# Patient Record
Sex: Female | Born: 1995 | Hispanic: Yes | Marital: Married | Smoking: Light tobacco smoker
Health system: Southern US, Community
[De-identification: ages and names within clinical notes are randomized; demographics above are authoritative.]

## PROBLEM LIST (undated history)

## (undated) DIAGNOSIS — Z789 Other specified health status: Secondary | ICD-10-CM

## (undated) HISTORY — PX: WISDOM TOOTH EXTRACTION: SHX21

---

## 2019-09-12 DIAGNOSIS — J069 Acute upper respiratory infection, unspecified: Secondary | ICD-10-CM | POA: Diagnosis not present

## 2019-09-12 DIAGNOSIS — Z6827 Body mass index (BMI) 27.0-27.9, adult: Secondary | ICD-10-CM | POA: Diagnosis not present

## 2019-10-08 ENCOUNTER — Other Ambulatory Visit: Payer: Self-pay

## 2019-10-08 ENCOUNTER — Ambulatory Visit: Payer: HRSA Program | Attending: Internal Medicine

## 2019-10-08 DIAGNOSIS — Z20822 Contact with and (suspected) exposure to covid-19: Secondary | ICD-10-CM

## 2019-10-08 DIAGNOSIS — U071 COVID-19: Secondary | ICD-10-CM | POA: Insufficient documentation

## 2019-10-08 DIAGNOSIS — Z20828 Contact with and (suspected) exposure to other viral communicable diseases: Secondary | ICD-10-CM | POA: Diagnosis not present

## 2019-10-09 ENCOUNTER — Telehealth: Payer: Self-pay

## 2019-10-09 LAB — NOVEL CORONAVIRUS, NAA: SARS-CoV-2, NAA: DETECTED — AB

## 2019-10-09 NOTE — Progress Notes (Signed)
Moving orders to this encounter.  

## 2019-10-09 NOTE — Progress Notes (Signed)
Order(s) created erroneously. Erroneous order ID: [id number]  Order moved by: Brigitte Pulse  Order move date/time: 10/09/2019 12:34 PM  Source Patient: [id number]  Source Contact: 10/08/2019  Destination Patient: [id number]  Destination Contact: 10/08/2019

## 2019-10-09 NOTE — Progress Notes (Signed)
Order(s) created erroneously. Erroneous order ID: [id number]  Order moved by: Addalie Calles M  Order move date/time: 10/09/2019 12:34 PM  Source Patient: [id number]  Source Contact: 10/08/2019  Destination Patient: [id number]  Destination Contact: 10/08/2019 

## 2019-10-09 NOTE — Telephone Encounter (Signed)
Caller advise that result not back yet  

## 2019-10-09 NOTE — Telephone Encounter (Signed)
Patient called in to confirmed her Central lab results - DOB/Address verified - confirmed Positive results. Reviewed Patient Positive Test Results with patient.  Patient reports the following symptoms: some fatigue, scratchy throat, head ache, nasal congestion, cough, sneezing, runny nose.  Patient denies: shortness of breath, fever, diarrhea, vomiting, nausea, loss of smell/taste.  Patient advised she is new to the area and does not have a PCP yet. Suggested the following PCP offices for her to research to see if they are accepting new patients:  * [facility] [place] [facility] [place]  Also, reviewed how to access e-visit on MyChart and contacting [facility] for more PCP options.   Patient verbalized understanding, no further questions.

## 2019-12-03 ENCOUNTER — Ambulatory Visit (INDEPENDENT_AMBULATORY_CARE_PROVIDER_SITE_OTHER): Payer: BC Managed Care – PPO | Admitting: Family Medicine

## 2019-12-03 ENCOUNTER — Other Ambulatory Visit: Payer: Self-pay

## 2019-12-03 ENCOUNTER — Encounter: Payer: Self-pay | Admitting: Family Medicine

## 2019-12-03 VITALS — BP 114/77 | HR 65 | Ht 60.0 in | Wt 137.0 lb

## 2019-12-03 DIAGNOSIS — Z113 Encounter for screening for infections with a predominantly sexual mode of transmission: Secondary | ICD-10-CM

## 2019-12-03 DIAGNOSIS — Z01419 Encounter for gynecological examination (general) (routine) without abnormal findings: Secondary | ICD-10-CM | POA: Diagnosis not present

## 2019-12-03 DIAGNOSIS — Z8349 Family history of other endocrine, nutritional and metabolic diseases: Secondary | ICD-10-CM | POA: Diagnosis not present

## 2019-12-03 DIAGNOSIS — Z124 Encounter for screening for malignant neoplasm of cervix: Secondary | ICD-10-CM | POA: Diagnosis not present

## 2019-12-03 DIAGNOSIS — Z8616 Personal history of COVID-19: Secondary | ICD-10-CM | POA: Insufficient documentation

## 2019-12-03 DIAGNOSIS — R7611 Nonspecific reaction to tuberculin skin test without active tuberculosis: Secondary | ICD-10-CM | POA: Diagnosis not present

## 2019-12-03 DIAGNOSIS — Z833 Family history of diabetes mellitus: Secondary | ICD-10-CM

## 2019-12-03 NOTE — Progress Notes (Signed)
GYNECOLOGY ANNUAL PREVENTATIVE CARE ENCOUNTER NOTE  Subjective:   Denise Wu is a 24 y.o. G0P0000 female here for a routine annual gynecologic exam.  Current complaints: Here to establish care.     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 Born in [place]- lived there for 9 years. Mother was treated for latent TB during her pregnancies when here in [place].   Denies abnormal vaginal bleeding, discharge, pelvic pain, problems with intercourse or other gynecologic concerns.    Gynecologic History Patient's last menstrual period was 11/21/2019 (exact date). Contraception: rhythm method Last Pap: Never had Last mammogram: NA  The following portions of the patient's history were reviewed and updated as appropriate: allergies, current medications, past family history, past medical history, past social history, past surgical history and problem list.  Review of Systems Pertinent items are noted in HPI.   Objective:  BP 114/77   Pulse 65   Ht 5' (1.524 m)   Wt 137 lb (62.1 kg)   LMP 11/21/2019 (Exact Date)   BMI 26.76 kg/m  CONSTITUTIONAL: Well-developed, well-nourished female in no acute distress.  HENT:  Normocephalic, atraumatic, External right and left ear normal. Oropharynx is clear and moist EYES:  No scleral icterus.  NECK: Normal range of motion, supple, no masses.  Normal thyroid.  SKIN: Skin is warm and dry. No rash noted. Not diaphoretic. No erythema. No pallor. NEUROLOGIC: Alert and oriented to person, place, and time. Normal reflexes, muscle tone coordination. No cranial nerve deficit noted. PSYCHIATRIC: Normal mood and affect. Normal behavior. Normal judgment and thought content. CARDIOVASCULAR: Normal heart rate noted, regular rhythm. 2+ distal pulses. RESPIRATORY: Effort and breath sounds normal, no problems with respiration noted. BREASTS: Symmetric in size. No masses, skin changes, nipple drainage, or lymphadenopathy. ABDOMEN: Soft,  no distention noted.  No  tenderness, rebound or guarding.  PELVIC: Normal appearing external genitalia; normal appearing vaginal mucosa and cervix.  No abnormal discharge noted.  Pap smear obtained.  Normal uterine size, no other palpable masses, no uterine or adnexal tenderness. MUSCULOSKELETAL: Normal range of motion.    Assessment and Plan:    1. PPD positive Discussed latent TB. Will check labs to confirm whether positive PPD was related to exposure vs BCG.  I recommend latent TB treatment prior to pregnancy.  - QuantiFERON-TB Gold Plus  2. Well woman exam with routine gynecological exam  Annual gynecologic examination with pap smear:  Will follow up results of pap smear and manage accordingly. STI screen also ordered today.  Routine preventative health maintenance measures emphasized.  Contraception counseling: Reviewed all forms of birth control options available including abstinence; over the counter/barrier methods; hormonal contraceptive medication including pill, patch, ring, injection,contraceptive implant; hormonal and nonhormonal IUDs; permanent sterilization options including vasectomy and the various tubal sterilization modalities. Risks and benefits reviewed.  Questions were answered.  Written information was also given to the patient to review.  Patient desires natural family planning, this was prescribed for patient. She will follow up in  54yr for surveillance.  She was told to call with any further questions, or with any concerns about this method of contraception.  Emphasized use of condoms 100% of the time for STI prevention.  - Cytology - PAP - QuantiFERON-TB Gold Plus - Hemoglobin A1c - TSH - Recommended starting PNV - Reviewed fertility tracking - Discussed healthy diet changes and importance of exercise   3. History of COVID-19 Sx resolved.  4. Family history of hypothyroidism - TSH  5. Family history of diabetes mellitus -  Hemoglobin A1c   Please refer to After Visit Summary for  other counseling recommendations.   Return in about 1 year (around 12/02/2020) for Yearly wellness exam.  Caren Macadam, MD, MPH, ABFM Attending Physician Center for [facility]

## 2019-12-04 LAB — CYTOLOGY - PAP
Chlamydia: NEGATIVE
Comment: NEGATIVE
Comment: NORMAL
Diagnosis: NEGATIVE
Neisseria Gonorrhea: NEGATIVE

## 2019-12-04 LAB — HEMOGLOBIN A1C
Est. average glucose Bld gHb Est-mCnc: 111 mg/dL
Hgb A1c MFr Bld: 5.5 % (ref 4.8–5.6)

## 2019-12-04 LAB — TSH: TSH: 1.25 u[IU]/mL (ref 0.450–4.500)

## 2019-12-06 LAB — QUANTIFERON-TB GOLD PLUS
QuantiFERON Mitogen Value: >10 IU/mL
QuantiFERON Nil Value: 0.08 IU/mL
QuantiFERON TB1 Ag Value: 0.08 IU/mL
QuantiFERON TB2 Ag Value: 0.07 IU/mL
QuantiFERON-TB Gold Plus: NEGATIVE

## 2020-01-24 ENCOUNTER — Ambulatory Visit: Payer: BC Managed Care – PPO | Attending: Internal Medicine

## 2020-01-24 DIAGNOSIS — Z23 Encounter for immunization: Secondary | ICD-10-CM

## 2020-01-24 NOTE — Progress Notes (Signed)
   Covid-19 Vaccination Clinic  Name:  Denise Wu    MRN: [id number] DOB: Oct 27, 1995  01/24/2020  [name] was observed post Covid-19 immunization for 15 minutes without incident. She was provided with Vaccine Information Sheet and instruction to access the V-Safe system.   [name] was instructed to call 911 with any severe reactions post vaccine: Marland Kitchen Difficulty breathing  . Swelling of face and throat  . A fast heartbeat  . A bad rash all over body  . Dizziness and weakness   Immunizations Administered    Name Date Dose VIS Date Route   Pfizer COVID-19 Vaccine 01/24/2020  3:51 PM 0.3 mL 10/03/2019 Intramuscular   Manufacturer: [employer], [employer]   Lot: [id number]   NDC: [address]-4

## 2020-02-14 ENCOUNTER — Ambulatory Visit: Payer: BC Managed Care – PPO | Attending: Internal Medicine

## 2020-02-14 DIAGNOSIS — Z23 Encounter for immunization: Secondary | ICD-10-CM

## 2020-02-14 NOTE — Progress Notes (Signed)
   Covid-19 Vaccination Clinic  Name:  Denise Wu    MRN: [id number] DOB: [birth year]  02/14/2020  [name] was observed post Covid-19 immunization for 15 minutes without incident. She was provided with Vaccine Information Sheet and instruction to access the V-Safe system.   [name] was instructed to call 911 with any severe reactions post vaccine: Marland Kitchen Difficulty breathing  . Swelling of face and throat  . A fast heartbeat  . A bad rash all over body  . Dizziness and weakness   Immunizations Administered    Name Date Dose VIS Date Route   Pfizer COVID-19 Vaccine 02/14/2020 12:23 PM 0.3 mL 12/17/2018 Intramuscular   Manufacturer: [employer], [employer]   Lot: [id number]   NDC: [address]-7

## 2020-06-22 ENCOUNTER — Encounter: Payer: Self-pay | Admitting: Radiology

## 2020-06-22 ENCOUNTER — Ambulatory Visit: Payer: BC Managed Care – PPO

## 2020-06-22 ENCOUNTER — Other Ambulatory Visit: Payer: Self-pay

## 2020-06-22 ENCOUNTER — Ambulatory Visit (INDEPENDENT_AMBULATORY_CARE_PROVIDER_SITE_OTHER): Payer: BC Managed Care – PPO

## 2020-06-22 VITALS — BP 115/75 | HR 72

## 2020-06-22 DIAGNOSIS — Z3201 Encounter for pregnancy test, result positive: Secondary | ICD-10-CM | POA: Diagnosis not present

## 2020-06-22 LAB — POCT URINE PREGNANCY: Preg Test, Ur: POSITIVE — AB

## 2020-06-22 NOTE — Progress Notes (Signed)
Denise Wu presents today for UPT. She does not have any  complaint at this time. LMP: 05/14/2020    OBJECTIVE: Appears well, in no apparent distress.  OB History    Gravida  0   Para  0   Term  0   Preterm  0   AB  0   Living  0     SAB  0   TAB  0   Ectopic  0   Multiple  0   Live Births  0          Home UPT Result: positive In-Office UPT result: positive I have reviewed the patient's medical, obstetrical, social, and family histories, and medications.   ASSESSMENT: Positive pregnancy test  PLAN Prenatal care to be completed at: Patient will start in our office around October. Patient has been advised to start taking prenatal vitamins.

## 2020-07-27 ENCOUNTER — Encounter: Payer: Self-pay | Admitting: Obstetrics & Gynecology

## 2020-07-27 ENCOUNTER — Other Ambulatory Visit (HOSPITAL_COMMUNITY)
Admission: RE | Admit: 2020-07-27 | Discharge: 2020-07-27 | Disposition: A | Discharge status: Home / Self Care | Payer: BC Managed Care – PPO | Source: Ambulatory Visit | Attending: Obstetrics & Gynecology | Admitting: Obstetrics & Gynecology

## 2020-07-27 ENCOUNTER — Other Ambulatory Visit: Payer: Self-pay

## 2020-07-27 ENCOUNTER — Ambulatory Visit (INDEPENDENT_AMBULATORY_CARE_PROVIDER_SITE_OTHER): Payer: BC Managed Care – PPO | Admitting: Obstetrics & Gynecology

## 2020-07-27 VITALS — BP 116/75 | HR 66 | Wt 135.0 lb

## 2020-07-27 DIAGNOSIS — Z3A10 10 weeks gestation of pregnancy: Secondary | ICD-10-CM | POA: Diagnosis not present

## 2020-07-27 DIAGNOSIS — Z3401 Encounter for supervision of normal first pregnancy, first trimester: Secondary | ICD-10-CM | POA: Diagnosis not present

## 2020-07-27 DIAGNOSIS — Z3403 Encounter for supervision of normal first pregnancy, third trimester: Secondary | ICD-10-CM | POA: Insufficient documentation

## 2020-07-27 NOTE — Progress Notes (Signed)
DATING AND VIABILITY SONOGRAM   Denise Wu is a 24 y.o. year old G1P0000 with LMP Patient's last menstrual period was 05/14/2020. which would correlate to  [redacted]w[redacted]d weeks gestation.  She has regular menstrual cycles.   She is here today for a confirmatory initial sonogram.    GESTATION: SINGLETON yes     FETAL ACTIVITY:          Heart rate    160          The fetus is active.    GESTATIONAL AGE AND  BIOMETRICS:  Gestational criteria: Estimated Date of Delivery: 02/18/21 by LMP now at [redacted]w[redacted]d  Previous Scans:0  GESTATIONAL SAC            mm         weeks  CROWN RUMP LENGTH           3.79 mm         10.4 weeks                                                   AVERAGE EGA(BY THIS SCAN):  10.4 weeks  WORKING EDD( LMP ):  02/18/2021     TECHNICIAN COMMENTS:  Patient informed that the ultrasound is considered a limited obstetric ultrasound and is not intended to be a complete ultrasound exam. Patient also informed that the ultrasound is not being completed with the intent of assessing for fetal or placental anomalies or any pelvic abnormalities. Explained that the purpose of today's ultrasound is to assess for fetal heart rate. Patient acknowledges the purpose of the exam and the limitations of the study.        A copy of this report including all images has been saved and backed up to a second source for retrieval if needed. All measures and details of the anatomical scan, placentation, fluid volume and pelvic anatomy are contained in that report.  Denise Wu 07/27/2020 8:49 AM

## 2020-07-27 NOTE — Patient Instructions (Signed)
First Trimester of Pregnancy The first trimester of pregnancy is from week 1 until the end of week 13 (months 1 through 3). A week after a sperm fertilizes an egg, the egg will implant on the wall of the uterus. This embryo will begin to develop into a baby. Genes from you and your partner will form the baby. The female genes will determine whether the baby will be a boy or a girl. At 6-8 weeks, the eyes and face will be formed, and the heartbeat can be seen on ultrasound. At the end of 12 weeks, all the baby's organs will be formed. Now that you are pregnant, you will want to do everything you can to have a healthy baby. Two of the most important things are to get good prenatal care and to follow your health care provider's instructions. Prenatal care is all the medical care you receive before the baby's birth. This care will help prevent, find, and treat any problems during the pregnancy and childbirth. Body changes during your first trimester Your body goes through many changes during pregnancy. The changes vary from woman to woman.  You may gain or lose a couple of pounds at first.  You may feel sick to your stomach (nauseous) and you may throw up (vomit). If the vomiting is uncontrollable, call your health care provider.  You may tire easily.  You may develop headaches that can be relieved by medicines. All medicines should be approved by your health care provider.  You may urinate more often. Painful urination may mean you have a bladder infection.  You may develop heartburn as a result of your pregnancy.  You may develop constipation because certain hormones are causing the muscles that push stool through your intestines to slow down.  You may develop hemorrhoids or swollen veins (varicose veins).  Your breasts may begin to grow larger and become tender. Your nipples may stick out more, and the tissue that surrounds them (areola) may become darker.  Your gums may bleed and may be  sensitive to brushing and flossing.  Dark spots or blotches (chloasma, mask of pregnancy) may develop on your face. This will likely fade after the baby is born.  Your menstrual periods will stop.  You may have a loss of appetite.  You may develop cravings for certain kinds of food.  You may have changes in your emotions from day to day, such as being excited to be pregnant or being concerned that something may go wrong with the pregnancy and baby.  You may have more vivid and strange dreams.  You may have changes in your hair. These can include thickening of your hair, rapid growth, and changes in texture. Some women also have hair loss during or after pregnancy, or hair that feels dry or thin. Your hair will most likely return to normal after your baby is born. What to expect at prenatal visits During a routine prenatal visit:  You will be weighed to make sure you and the baby are growing normally.  Your blood pressure will be taken.  Your abdomen will be measured to track your baby's growth.  The fetal heartbeat will be listened to between weeks 10 and 14 of your pregnancy.  Test results from any previous visits will be discussed. Your health care provider may ask you:  How you are feeling.  If you are feeling the baby move.  If you have had any abnormal symptoms, such as leaking fluid, bleeding, severe headaches, or abdominal   cramping.  If you are using any tobacco products, including cigarettes, chewing tobacco, and electronic cigarettes.  If you have any questions. Other tests that may be performed during your first trimester include:  Blood tests to find your blood type and to check for the presence of any previous infections. The tests will also be used to check for low iron levels (anemia) and protein on red blood cells (Rh antibodies). Depending on your risk factors, or if you previously had diabetes during pregnancy, you may have tests to check for high blood sugar  that affects pregnant women (gestational diabetes).  Urine tests to check for infections, diabetes, or protein in the urine.  An ultrasound to confirm the proper growth and development of the baby.  Fetal screens for spinal cord problems (spina bifida) and Down syndrome.  HIV (human immunodeficiency virus) testing. Routine prenatal testing includes screening for HIV, unless you choose not to have this test.  You may need other tests to make sure you and the baby are doing well. Follow these instructions at home: Medicines  Follow your health care provider's instructions regarding medicine use. Specific medicines may be either safe or unsafe to take during pregnancy.  Take a prenatal vitamin that contains at least 600 micrograms (mcg) of folic acid.  If you develop constipation, try taking a stool softener if your health care provider approves. Eating and drinking   Eat a balanced diet that includes fresh fruits and vegetables, whole grains, good sources of protein such as meat, eggs, or tofu, and low-fat dairy. Your health care provider will help you determine the amount of weight gain that is right for you.  Avoid raw meat and uncooked cheese. These carry germs that can cause birth defects in the baby.  Eating four or five small meals rather than three large meals a day may help relieve nausea and vomiting. If you start to feel nauseous, eating a few soda crackers can be helpful. Drinking liquids between meals, instead of during meals, also seems to help ease nausea and vomiting.  Limit foods that are high in fat and processed sugars, such as fried and sweet foods.  To prevent constipation: ? Eat foods that are high in fiber, such as fresh fruits and vegetables, whole grains, and beans. ? Drink enough fluid to keep your urine clear or pale yellow. Activity  Exercise only as directed by your health care provider. Most women can continue their usual exercise routine during  pregnancy. Try to exercise for 30 minutes at least 5 days a week. Exercising will help you: ? Control your weight. ? Stay in shape. ? Be prepared for labor and delivery.  Experiencing pain or cramping in the lower abdomen or lower back is a good sign that you should stop exercising. Check with your health care provider before continuing with normal exercises.  Try to avoid standing for long periods of time. Move your legs often if you must stand in one place for a long time.  Avoid heavy lifting.  Wear low-heeled shoes and practice good posture.  You may continue to have sex unless your health care provider tells you not to. Relieving pain and discomfort  Wear a good support bra to relieve breast tenderness.  Take warm sitz baths to soothe any pain or discomfort caused by hemorrhoids. Use hemorrhoid cream if your health care provider approves.  Rest with your legs elevated if you have leg cramps or low back pain.  If you develop varicose veins in   your legs, wear support hose. Elevate your feet for 15 minutes, 3-4 times a day. Limit salt in your diet. Prenatal care  Schedule your prenatal visits by the twelfth week of pregnancy. They are usually scheduled monthly at first, then more often in the last 2 months before delivery.  Write down your questions. Take them to your prenatal visits.  Keep all your prenatal visits as told by your health care provider. This is important. Safety  Wear your seat belt at all times when driving.  Make a list of emergency phone numbers, including numbers for family, friends, the hospital, and police and fire departments. General instructions  Ask your health care provider for a referral to a local prenatal education class. Begin classes no later than the beginning of month 6 of your pregnancy.  Ask for help if you have counseling or nutritional needs during pregnancy. Your health care provider can offer advice or refer you to specialists for help  with various needs.  Do not use hot tubs, steam rooms, or saunas.  Do not douche or use tampons or scented sanitary pads.  Do not cross your legs for long periods of time.  Avoid cat litter boxes and soil used by cats. These carry germs that can cause birth defects in the baby and possibly loss of the fetus by miscarriage or stillbirth.  Avoid all smoking, herbs, alcohol, and medicines not prescribed by your health care provider. Chemicals in these products affect the formation and growth of the baby.  Do not use any products that contain nicotine or tobacco, such as cigarettes and e-cigarettes. If you need help quitting, ask your health care provider. You may receive counseling support and other resources to help you quit.  Schedule a dentist appointment. At home, brush your teeth with a soft toothbrush and be gentle when you floss. Contact a health care provider if:  You have dizziness.  You have mild pelvic cramps, pelvic pressure, or nagging pain in the abdominal area.  You have persistent nausea, vomiting, or diarrhea.  You have a bad smelling vaginal discharge.  You have pain when you urinate.  You notice increased swelling in your face, hands, legs, or ankles.  You are exposed to fifth disease or chickenpox.  You are exposed to German measles (rubella) and have never had it. Get help right away if:  You have a fever.  You are leaking fluid from your vagina.  You have spotting or bleeding from your vagina.  You have severe abdominal cramping or pain.  You have rapid weight gain or loss.  You vomit blood or material that looks like coffee grounds.  You develop a severe headache.  You have shortness of breath.  You have any kind of trauma, such as from a fall or a car accident. Summary  The first trimester of pregnancy is from week 1 until the end of week 13 (months 1 through 3).  Your body goes through many changes during pregnancy. The changes vary from  woman to woman.  You will have routine prenatal visits. During those visits, your health care provider will examine you, discuss any test results you may have, and talk with you about how you are feeling. This information is not intended to replace advice given to you by your health care provider. Make sure you discuss any questions you have with your health care provider. Document Revised: 09/21/2017 Document Reviewed: 09/20/2016 Elsevier Patient Education  2020 Elsevier Inc.  

## 2020-07-27 NOTE — Progress Notes (Signed)
History:   Denise Wu is a 24 y.o. G1P0000 at [redacted]w[redacted]d by LMP and office scan today being seen today for her first obstetrical visit.  Patient does intend to breast feed. Pregnancy history fully reviewed.  Patient reports no complaints.  Resolved nausea from earlier this pregnancy.      HISTORY: OB History  Gravida Para Term Preterm AB Living  1 0 0 0 0 0  SAB TAB Ectopic Multiple Live Births  0 0 0 0 0    # Outcome Date GA Lbr Len/2nd Weight Sex Delivery Anes PTL Lv  1 Current           Last pap smear was done 12/03/2019 and was normal  History reviewed. No pertinent past medical history. Past Surgical History:  Procedure Laterality Date  . WISDOM TOOTH EXTRACTION     Family History  Problem Relation Age of Onset  . Hypothyroidism Mother   . Diabetes Paternal Grandfather    Social History   Tobacco Use  . Smoking status: Light Tobacco Smoker  . Smokeless tobacco: Never Used  . Tobacco comment: socailly  Vaping Use  . Vaping Use: Never assessed  Substance Use Topics  . Alcohol use: Yes    Comment: social   . Drug use: Not Currently   No Known Allergies Current Outpatient Medications on File Prior to Visit  Medication Sig Dispense Refill  . Prenatal Vit-Fe Fumarate-FA (MULTIVITAMIN-PRENATAL) 27-0.8 MG TABS tablet Take 1 tablet by mouth daily at 12 noon.     No current facility-administered medications on file prior to visit.    Review of Systems Pertinent items noted in HPI and remainder of comprehensive ROS otherwise negative. Physical Exam:   Vitals:   07/27/20 0835  BP: 116/75  Pulse: 66  Weight: 135 lb (61.2 kg)   Fetal Heart Rate (bpm): 160 bpm on ultrasound      General: well-developed, well-nourished female in no acute distress  Breasts:  normal appearance, no masses or tenderness bilaterally  Skin: normal coloration and turgor, no rashes  Neurologic: oriented, normal, negative, normal mood  Extremities: normal strength, tone, and muscle mass,  ROM of all joints is normal  Mouth/Teeth mucous membranes moist, pharynx normal without lesions and dental hygiene good  Neck supple and no masses  Cardiovascular: regular rate and rhythm  Respiratory:  no respiratory distress, normal breath sounds  Abdomen: soft, non-tender; bowel sounds normal; no masses,  no organomegaly    Assessment:    Pregnancy: G1P0000 Patient Active Problem List   Diagnosis Date Noted  . Encounter for supervision of normal first pregnancy in first trimester 07/27/2020  . PPD positive 12/03/2019  . History of COVID-19 12/03/2019     Plan:    1. [redacted] weeks gestation of pregnancy 2. Encounter for supervision of normal first pregnancy in first trimester - CBC/D/Plt+RPR+Rh+ABO+Rub Ab... - Culture, OB Urine - GC/Chlamydia probe amp ([facility])not at [facility] [place] M[facility] OB COMP + 14 WK; Future - Enroll Patient in Babyscripts Initial labs drawn. Continue prenatal vitamins. Problem list reviewed and updated. Genetic Screening discussed: considering her options. Ultrasound discussed; fetal anatomic survey: ordered. Anticipatory guidance about prenatal visits given including labs, ultrasounds, and testing. Discussed usage of Babyscripts and virtual visits as additional source of managing and completing prenatal visits in midst of coronavirus and pandemic.   Encouraged to complete MyChart Registration for her ability to review results, send requests, and have questions addressed.  The nature of [facility]  Healthcare/Faculty Practice with multiple MDs and Advanced Practice Providers was explained to patient; also emphasized that residents, students are part of our team. Routine obstetric precautions reviewed. Encouraged to seek out care at office or emergency room [facility] preferred) for urgent and/or emergent concerns. Return in about 4 weeks (around 08/24/2020) for OFFICE OB Visit.     Jaynie Collins, MD, FACOG Obstetrician & Gynecologist,  [facility] for [employer], [facility]

## 2020-07-28 LAB — GC/CHLAMYDIA PROBE AMP (~~LOC~~) NOT AT ARMC
Chlamydia: NEGATIVE
Comment: NEGATIVE
Comment: NORMAL
Neisseria Gonorrhea: NEGATIVE

## 2020-07-28 LAB — CBC/D/PLT+RPR+RH+ABO+RUB AB...
Antibody Screen: NEGATIVE
Basophils Absolute: 0 10*3/uL (ref 0.0–0.2)
Basos: 0 %
EOS (ABSOLUTE): 0.1 10*3/uL (ref 0.0–0.4)
Eos: 1 %
HCV Ab: <0.1 s/co ratio (ref 0.0–0.9)
HIV Screen 4th Generation wRfx: NONREACTIVE
Hematocrit: 40.6 % (ref 34.0–46.6)
Hemoglobin: 13.6 g/dL (ref 11.1–15.9)
Hepatitis B Surface Ag: NEGATIVE
Immature Grans (Abs): 0 10*3/uL (ref 0.0–0.1)
Immature Granulocytes: 0 %
Lymphocytes Absolute: 1.4 10*3/uL (ref 0.7–3.1)
Lymphs: 21 %
MCH: 30.4 pg (ref 26.6–33.0)
MCHC: 33.5 g/dL (ref 31.5–35.7)
MCV: 91 fL (ref 79–97)
Monocytes Absolute: 0.3 10*3/uL (ref 0.1–0.9)
Monocytes: 5 %
Neutrophils Absolute: 4.6 10*3/uL (ref 1.4–7.0)
Neutrophils: 73 %
Platelets: 290 10*3/uL (ref 150–450)
RBC: 4.48 x10E6/uL (ref 3.77–5.28)
RDW: 12.9 % (ref 11.7–15.4)
RPR Ser Ql: NONREACTIVE
Rh Factor: POSITIVE
Rubella Antibodies, IGG: 6.55 index (ref >0.99)
WBC: 6.4 10*3/uL (ref 3.4–10.8)

## 2020-07-28 LAB — HCV INTERPRETATION

## 2020-07-29 LAB — URINE CULTURE, OB REFLEX: Organism ID, Bacteria: NO GROWTH

## 2020-07-29 LAB — CULTURE, OB URINE

## 2020-09-01 ENCOUNTER — Other Ambulatory Visit: Payer: Self-pay

## 2020-09-01 ENCOUNTER — Ambulatory Visit (INDEPENDENT_AMBULATORY_CARE_PROVIDER_SITE_OTHER): Payer: BC Managed Care – PPO | Admitting: Advanced Practice Midwife

## 2020-09-01 VITALS — BP 112/68 | HR 64 | Wt 133.2 lb

## 2020-09-01 DIAGNOSIS — Z3402 Encounter for supervision of normal first pregnancy, second trimester: Secondary | ICD-10-CM

## 2020-09-01 NOTE — Patient Instructions (Signed)
Second Trimester of Pregnancy  The second trimester is from week 14 through week 27 (month 4 through 6). This is often the time in pregnancy that you feel your best. Often times, morning sickness has lessened or quit. You may have more energy, and you may get hungry more often. Your unborn baby is growing rapidly. At the end of the sixth month, he or she is about 9 inches long and weighs about 1 pounds. You will likely feel the baby move between 18 and 20 weeks of pregnancy. Follow these instructions at home: Medicines  Take over-the-counter and prescription medicines only as told by your doctor. Some medicines are safe and some medicines are not safe during pregnancy.  Take a prenatal vitamin that contains at least 600 micrograms (mcg) of folic acid.  If you have trouble pooping (constipation), take medicine that will make your stool soft (stool softener) if your doctor approves. Eating and drinking   Eat regular, healthy meals.  Avoid raw meat and uncooked cheese.  If you get low calcium from the food you eat, talk to your doctor about taking a daily calcium supplement.  Avoid foods that are high in fat and sugars, such as fried and sweet foods.  If you feel sick to your stomach (nauseous) or throw up (vomit): ? Eat 4 or 5 small meals a day instead of 3 large meals. ? Try eating a few soda crackers. ? Drink liquids between meals instead of during meals.  To prevent constipation: ? Eat foods that are high in fiber, like fresh fruits and vegetables, whole grains, and beans. ? Drink enough fluids to keep your pee (urine) clear or pale yellow. Activity  Exercise only as told by your doctor. Stop exercising if you start to have cramps.  Do not exercise if it is too hot, too humid, or if you are in a place of great height (high altitude).  Avoid heavy lifting.  Wear low-heeled shoes. Sit and stand up straight.  You can continue to have sex unless your doctor tells you not  to. Relieving pain and discomfort  Wear a good support bra if your breasts are tender.  Take warm water baths (sitz baths) to soothe pain or discomfort caused by hemorrhoids. Use hemorrhoid cream if your doctor approves.  Rest with your legs raised if you have leg cramps or low back pain.  If you develop puffy, bulging veins (varicose veins) in your legs: ? Wear support hose or compression stockings as told by your doctor. ? Raise (elevate) your feet for 15 minutes, 3-4 times a day. ? Limit salt in your food. Prenatal care  Write down your questions. Take them to your prenatal visits.  Keep all your prenatal visits as told by your doctor. This is important. Safety  Wear your seat belt when driving.  Make a list of emergency phone numbers, including numbers for family, friends, the hospital, and police and fire departments. General instructions  Ask your doctor about the right foods to eat or for help finding a counselor, if you need these services.  Ask your doctor about local prenatal classes. Begin classes before month 6 of your pregnancy.  Do not use hot tubs, steam rooms, or saunas.  Do not douche or use tampons or scented sanitary pads.  Do not cross your legs for long periods of time.  Visit your dentist if you have not done so. Use a soft toothbrush to brush your teeth. Floss gently.  Avoid all smoking, herbs,   and alcohol. Avoid drugs that are not approved by your doctor.  Do not use any products that contain nicotine or tobacco, such as cigarettes and e-cigarettes. If you need help quitting, ask your doctor.  Avoid cat litter boxes and soil used by cats. These carry germs that can cause birth defects in the baby and can cause a loss of your baby (miscarriage) or stillbirth. Contact a doctor if:  You have mild cramps or pressure in your lower belly.  You have pain when you pee (urinate).  You have bad smelling fluid coming from your vagina.  You continue to  feel sick to your stomach (nauseous), throw up (vomit), or have watery poop (diarrhea).  You have a nagging pain in your belly area.  You feel dizzy. Get help right away if:  You have a fever.  You are leaking fluid from your vagina.  You have spotting or bleeding from your vagina.  You have severe belly cramping or pain.  You lose or gain weight rapidly.  You have trouble catching your breath and have chest pain.  You notice sudden or extreme puffiness (swelling) of your face, hands, ankles, feet, or legs.  You have not felt the baby move in over an hour.  You have severe headaches that do not go away when you take medicine.  You have trouble seeing. Summary  The second trimester is from week 14 through week 27 (months 4 through 6). This is often the time in pregnancy that you feel your best.  To take care of yourself and your unborn baby, you will need to eat healthy meals, take medicines only if your doctor tells you to do so, and do activities that are safe for you and your baby.  Call your doctor if you get sick or if you notice anything unusual about your pregnancy. Also, call your doctor if you need help with the right food to eat, or if you want to know what activities are safe for you. This information is not intended to replace advice given to you by your health care provider. Make sure you discuss any questions you have with your health care provider. Document Revised: 01/31/2019 Document Reviewed: 11/14/2016 Elsevier Patient Education  2020 [employer].  DOULA LIST   [facility] Doula  [place]  [phone]  [email or web address]  [email or web address]  [place] [employer] [phone]  [email or web address]   [employer]   Precious Danford Bad   [email or web address]   ??THE MOTHERLY DOULA?? Zola Button   [phone]   [email or web address]     [employer] Doula   Olive Bass  [phone]    [email or web address]   [employer] [facility]  Angie Rosier     [phone]     [email or web address]   Renato Gails: Doula & Photographer   Renato Gails [phone]       [email or web address]  [email or web address]   [employer] Doula Services  [place] [phone]   [email or web address]   [place], [place]  Lolita Rieger  [phone]  [email or web address]   [email or web address]   [facility] [facility]   Kizzie Furnish   [phone]  [email or web address]   [facility] Doula  Dina Rich  [phone] [email or web address]  Natural Baby Doulas  Cornelious Bryant         [facility]       Lora Reynolds     [phone] [email or web address]  [email or web address]  [facility]   [place] [phone] [email or web address]   [employer] [facility]  Camelia Eng     [phone]  [email or web address]  [facility]     [phone]  [email or web address]  Facebook and IG [email or web address] .  [phone] [email or web address] .[phone] [email or web address] .[email or web address]   [phone]  [phone] [email or web address]     [facility]  [phone] [email or web address]   IG [email or web address]    [phone]    [phone] [email or web address]   Lurline Hare "[place]" Free  [phone] [email or web address]    Mtende Roll  [phone] [email or web address]   Susie Williams   [email or web address]    [phone]    [phone] [email or web address]     Denita Lung  [phone] [email or web address]    Lenard Galloway  [phone] [email or web address]    Wrens Rhem    [phone]   [facility] on the Brain Red wing   [phone] [email or web address]  Doula Mama [phone] [email or web address] [email or web address]  Baby on the Brain Maryjean Ka  [phone] [email or web address]  [facility] [phone]  [email or web address]  [email or web address]-Jones  [phone] [email or web address] .[email or web address]   Allen Kell [phone] [email or web address] .Ardine Eng   [phone] [email or web address]   Gilda Crease  [phone] [email or web address]  [employer]  Jonelle Sports  [phone] [email or web address]   Jackelyn Poling [phone] [email or web address]  MOOR Divine Myeka Dunn  [email or web address]   Cristina Gong [phone] [email or web address]   Glory Buff [phone] [email or web address]   Whitney Muse [phone] [email or web address]

## 2020-09-01 NOTE — Progress Notes (Signed)
   PRENATAL VISIT NOTE  Subjective:  Denise Wu is a 24 y.o. G1P0000 at [redacted]w[redacted]d being seen today for ongoing prenatal care.  She is currently monitored for the following issues for this low-risk pregnancy and has PPD positive; History of COVID-19; and Encounter for supervision of normal first pregnancy in first trimester on their problem list.  Patient reports no complaints.  Contractions: Not present. Vag. Bleeding: None.   . Denies leaking of fluid.   The following portions of the patient's history were reviewed and updated as appropriate: allergies, current medications, past family history, past medical history, past social history, past surgical history and problem list. Problem list updated.  Objective:   Vitals:   09/01/20 1545  BP: 112/68  Pulse: 64  Weight: 133 lb 3.2 oz (60.4 kg)    Fetal Status: Fetal Heart Rate (bpm): 149         General:  Alert, oriented and cooperative. Patient is in no acute distress.  Skin: Skin is warm and dry. No rash noted.   Cardiovascular: Normal heart rate noted  Respiratory: Normal respiratory effort, no problems with respiration noted  Abdomen: Soft, gravid, appropriate for gestational age.  Pain/Pressure: Absent     Pelvic: Cervical exam deferred        Extremities: Normal range of motion.     Mental Status: Normal mood and affect. Normal behavior. Normal judgment and thought content.   Assessment and Plan:  Pregnancy: G1P0000 at [redacted]w[redacted]d  1. Encounter for supervision of normal first pregnancy in second trimester - LOB, routine care - Discussed typical changes in second trimester - Advised to review classes on [facility] site - Flu declined today  Preterm labor symptoms and general obstetric precautions including but not limited to vaginal bleeding, contractions, leaking of fluid and fetal movement were reviewed in detail with the patient. Please refer to After Visit Summary for other counseling recommendations.  Return in about 4  weeks (around 09/29/2020).  Future Appointments  Date Time Provider Department Center  09/29/2020  4:10 PM Calvert Cantor, [place] [facility]  10/07/2020  2:00 PM [facility]    Calvert Cantor, CNM

## 2020-09-29 ENCOUNTER — Telehealth: Payer: Self-pay | Admitting: Radiology

## 2020-09-29 ENCOUNTER — Encounter: Payer: BC Managed Care – PPO | Admitting: Advanced Practice Midwife

## 2020-09-29 NOTE — Telephone Encounter (Signed)
Patient called and cancelled appointment for 09/29/20, stated that she was working and unable to do in person or virtual, patient wanted to rescheduled for two weeks out on 10/13/20 with midwife.

## 2020-10-07 ENCOUNTER — Ambulatory Visit: Payer: BC Managed Care – PPO | Attending: Obstetrics & Gynecology

## 2020-10-07 ENCOUNTER — Ambulatory Visit: Payer: BC Managed Care – PPO

## 2020-10-07 ENCOUNTER — Other Ambulatory Visit: Payer: Self-pay

## 2020-10-07 ENCOUNTER — Encounter: Payer: Self-pay | Admitting: Obstetrics & Gynecology

## 2020-10-07 DIAGNOSIS — Z3A20 20 weeks gestation of pregnancy: Secondary | ICD-10-CM | POA: Diagnosis not present

## 2020-10-07 DIAGNOSIS — Z3A10 10 weeks gestation of pregnancy: Secondary | ICD-10-CM

## 2020-10-07 DIAGNOSIS — O359XX0 Maternal care for (suspected) fetal abnormality and damage, unspecified, not applicable or unspecified: Secondary | ICD-10-CM | POA: Diagnosis not present

## 2020-10-07 DIAGNOSIS — O321XX0 Maternal care for breech presentation, not applicable or unspecified: Secondary | ICD-10-CM

## 2020-10-07 DIAGNOSIS — Z3401 Encounter for supervision of normal first pregnancy, first trimester: Secondary | ICD-10-CM

## 2020-10-07 DIAGNOSIS — Z3689 Encounter for other specified antenatal screening: Secondary | ICD-10-CM | POA: Diagnosis not present

## 2020-10-13 ENCOUNTER — Ambulatory Visit (INDEPENDENT_AMBULATORY_CARE_PROVIDER_SITE_OTHER): Payer: BC Managed Care – PPO | Admitting: Advanced Practice Midwife

## 2020-10-13 ENCOUNTER — Other Ambulatory Visit: Payer: Self-pay

## 2020-10-13 VITALS — BP 103/69 | HR 65 | Wt 134.0 lb

## 2020-10-13 DIAGNOSIS — Z3402 Encounter for supervision of normal first pregnancy, second trimester: Secondary | ICD-10-CM

## 2020-10-13 DIAGNOSIS — Z3A21 21 weeks gestation of pregnancy: Secondary | ICD-10-CM

## 2020-10-13 NOTE — Patient Instructions (Addendum)
Considering Waterbirth? Guide for patients at [facility] ([facility]) Why consider waterbirth? . Gentle birth for babies  . Less pain medicine used in labor  . May allow for passive descent/less pushing  . May reduce perineal tears  . More mobility and instinctive maternal position changes  . Increased maternal relaxation   Is waterbirth safe? What are the risks of infection, drowning or other complications? . Infection:  . Very low risk (3.7 % for tub vs 4.8% for bed)  . 7 in 8000 waterbirths with documented infection  . Poorly cleaned equipment most common cause  . Slightly lower group B strep transmission rate  . Drowning  . Maternal:  . Very low risk  . Related to seizures or fainting  . Newborn:  . Very low risk. No evidence of increased risk of respiratory problems in multiple large studies  . Physiological protection from breathing under water  . Avoid underwater birth if there are any fetal complications  . Once baby's head is out of the water, keep it out.  . Birth complication  . Some reports of cord trauma, but risk decreased by bringing baby to surface gradually  . No evidence of increased risk of shoulder dystocia. Mothers can usually change positions faster in water than in a bed, possibly aiding the maneuvers to free the shoulder.   There are 2 things you MUST do to have a waterbirth with [facility]: 1. Attend a waterbirth class at [facility]   a. 3rd Wednesday of every month from 7-9 pm (virtual during COVID) b. Free c. Register by calling [phone] or register online at [email or web address] d. Bring us the certificate from the class to your prenatal appointment or send via MyChart 2. Meet with a midwife at 36 weeks* to see if you can still plan a waterbirth and to sign the consent.   *We also recommend that you schedule as many of your prenatal visits with a midwife as possible.    Helpful information: . You may  want to bring a bathing suit top to the hospital to wear during labor but this is optional.  All other supplies are provided by the hospital. . Please arrive at the hospital with signs of active labor, and do not wait at home until late in labor. It takes 45 min- 2 hours for COVID testing, fetal monitoring, and check in to your room to take place, plus transport and filling of the waterbirth tub.    Things that would prevent you from having a waterbirth: . Unknown or Positive COVID-19 diagnosis upon admission to hospital* . Premature, <37wks  . Previous cesarean birth  . Presence of thick meconium-stained fluid  . Multiple gestation (Twins, triplets, etc.)  . Uncontrolled diabetes or gestational diabetes requiring medication  . Hypertension diagnosed in pregnancy or preexisting hypertension (gestational hypertension, preeclampsia, or chronic hypertension) . Heavy vaginal bleeding  . Non-reassuring fetal heart rate  . Active infection (MRSA, etc.). Group B Strep is NOT a contraindication for waterbirth.  . If your labor has to be induced and induction method requires continuous monitoring of the baby's heart rate  . Other risks/issues identified by your obstetrical provider   Please remember that birth is unpredictable. Under certain unforeseeable circumstances your provider may advise against giving birth in the tub. These decisions will be made on a case-by-case basis and with the safety of you and your baby as our highest priority.   *Please remember that in order   to have a waterbirth, you must test Negative to COVID-19 upon admission to the hospital.  Updated 09/07/2020  DOULA LIST   [facility] [employer] Doula  [place]  [phone]  [email or web address]  [email or web address]  [employer] [phone]  [email or web address]   [employer]   Precious Danford Bad   [email or web address]   ??THE MOTHERLY  DOULA?? Zola Button   [phone]   [email or web address] .[email or web address]     The Abundant Life Doula  Olive Bass  [phone]    [email or web address]   [employer] [facility]a [facility]  Angie Rosier     [phone]     [email or web address]   Renato Gails: Doula & Photographer   Renato Gails [phone]       [email or web address]  [email or web address]   [employer] Doula Services  [place] [phone]   [email or web address] [place], [place]  Lolita Rieger  [phone]  [email or web address]   [email or web address]   [facility] Doul[facility]   Kizzie Furnish   [phone]  [email or web address]   Dina Rich Kirvin Doula  Dina Rich  [phone] [email or web address]  Natural Baby Doulas  Cornelious Bryant         [facility]       Lora Reynolds     [phone] [email or web address]  [email or web address]   [facility]   [place] [phone] [email or web address]   [facility] Doula [facility]  Camelia Eng     [phone]  [email or web address]  [facility]     [phone]  [email or web address]  Facebook and IG @[email or web address] .  [phone] [email or web address] .[phone] [email or web address]   [phone]  [phone] [email or web address]     [facility]  [phone] [email or web address]   IG [email or web address]    [phone]    [phone] [email or web address]   Lurline Hare "[place]" Free  [phone] [email or web address]    Mtende Roll  [phone] [email or web address]   Susie Williams   [email or web address]    [phone]    [phone] [email or web address]     Denita Lung  [phone] [email or web address]    Lenard Galloway  [phone]  [email or web address]    Thurston Rhem    [phone]   Baby on the Brain Red wing  [phone] [email or web address]  Doula Mama [phone] [email or web address] [email or web address]  Baby on the Brain Maryjean Ka  [phone] [email or web address]  [facility] [phone]  [email or web address]  [email or web address] Harris-Jones  [phone] [email or web address] .[email or web address]   Allen Kell [phone] [email or web address] .Ardine Eng   [phone] [email or web address]   Gilda Crease  [phone] [email or web address]  [employer]  Jonelle Sports  [phone] [email or web address] .[email or web address]   Jackelyn Poling [phone] [email or web address]  MOOR Divine Myeka Dunn  [email or web address] .[email or web address]   Cristina Gong [phone] [email or web address] Buff [phone] [email or web address]   [place]ev[place] Ny [phone] [email or web address]           Second Trimester of Pregnancy  The second trimester is from week 14 through week 27 (month 4 through 6). This is often the time in pregnancy that you feel your best. Often times, morning sickness has lessened or quit. You may have more energy, and you may get hungry more often. Your unborn baby is growing rapidly. At the end of the sixth month, he or she is about 9 inches long and weighs about 1 pounds. You will likely feel the baby move between 18 and 20 weeks of pregnancy. Follow these  instructions at home: Medicines  Take over-the-counter and prescription medicines only as told by your doctor. Some medicines are safe and some medicines are not safe during pregnancy.  Take a prenatal vitamin that contains at least 600 micrograms (mcg) of folic acid.  If you have trouble pooping (constipation), take medicine that will make your stool soft (stool softener) if  your doctor approves. Eating and drinking   Eat regular, healthy meals.  Avoid raw meat and uncooked cheese.  If you get low calcium from the food you eat, talk to your doctor about taking a daily calcium supplement.  Avoid foods that are high in fat and sugars, such as fried and sweet foods.  If you feel sick to your stomach (nauseous) or throw up (vomit): ? Eat 4 or 5 small meals a day instead of 3 large meals. ? Try eating a few soda crackers. ? Drink liquids between meals instead of during meals.  To prevent constipation: ? Eat foods that are high in fiber, like fresh fruits and vegetables, whole grains, and beans. ? Drink enough fluids to keep your pee (urine) clear or pale yellow. Activity  Exercise only as told by your doctor. Stop exercising if you start to have cramps.  Do not exercise if it is too hot, too humid, or if you are in a place of great height (high altitude).  Avoid heavy lifting.  Wear low-heeled shoes. Sit and stand up straight.  You can continue to have sex unless your doctor tells you not to. Relieving pain and discomfort  Wear a good support bra if your breasts are tender.  Take warm water baths (sitz baths) to soothe pain or discomfort caused by hemorrhoids. Use hemorrhoid cream if your doctor approves.  Rest with your legs raised if you have leg cramps or low back pain.  If you develop puffy, bulging veins (varicose veins) in your legs: ? Wear support hose or compression stockings as told by your doctor. ? Raise (elevate) your feet for 15 minutes, 3-4 times a day. ? Limit salt in your food. Prenatal care  Write down your questions. Take them to your prenatal visits.  Keep all your prenatal visits as told by your doctor. This is important. Safety  Wear your seat belt when driving.  Make a list of emergency phone numbers, including numbers for family, friends, the hospital, and police and fire departments. General instructions  Ask your  doctor about the right foods to eat or for help finding a counselor, if you need these services.  Ask your doctor about local prenatal classes. Begin classes before month 6 of your pregnancy.  Do not use hot tubs, steam rooms, or saunas.  Do not douche or use tampons or scented sanitary pads.  Do not cross your legs for long periods of time.  Visit your dentist if you have not done so. Use a soft toothbrush to brush your teeth. Floss gently.  Avoid all smoking, herbs, and alcohol. Avoid drugs that are not approved by your doctor.  Do not use any products that contain nicotine or tobacco, such as cigarettes and e-cigarettes. If you need help quitting, ask your doctor.  Avoid cat litter boxes and soil used by cats. These carry germs that can cause birth defects in the baby and can cause a loss of your baby (miscarriage) or stillbirth. Contact a doctor if:  You have mild cramps or pressure in your lower belly.  You have pain when you pee (urinate).  You  have bad smelling fluid coming from your vagina.  You continue to feel sick to your stomach (nauseous), throw up (vomit), or have watery poop (diarrhea).  You have a nagging pain in your belly area.  You feel dizzy. Get help right away if:  You have a fever.  You are leaking fluid from your vagina.  You have spotting or bleeding from your vagina.  You have severe belly cramping or pain.  You lose or gain weight rapidly.  You have trouble catching your breath and have chest pain.  You notice sudden or extreme puffiness (swelling) of your face, hands, ankles, feet, or legs.  You have not felt the baby move in over an hour.  You have severe headaches that do not go away when you take medicine.  You have trouble seeing. Summary  The second trimester is from week 14 through week 27 (months 4 through 6). This is often the time in pregnancy that you feel your best.  To take care of yourself and your unborn baby, you will  need to eat healthy meals, take medicines only if your doctor tells you to do so, and do activities that are safe for you and your baby.  Call your doctor if you get sick or if you notice anything unusual about your pregnancy. Also, call your doctor if you need help with the right food to eat, or if you want to know what activities are safe for you. This information is not intended to replace advice given to you by your health care provider. Make sure you discuss any questions you have with your health care provider. Document Revised: 01/31/2019 Document Reviewed: 11/14/2016 Elsevier Patient Education  2020 [employer].

## 2020-10-13 NOTE — Progress Notes (Signed)
   PRENATAL VISIT NOTE  Subjective:  Meli Faley is a 24 y.o. G1P0000 at [redacted]w[redacted]d being seen today for ongoing prenatal care.  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  Patient reports no complaints.  Contractions: Not present. Vag. Bleeding: None.  Movement: Present. Denies leaking of fluid.   The following portions of the patient's history were reviewed and updated as appropriate: allergies, current medications, past family history, past medical history, past social history, past surgical history and problem list. Problem list updated.  Objective:   Vitals:   10/13/20 1610  BP: 103/69  Pulse: 65  Weight: 134 lb (60.8 kg)    Fetal Status: Fetal Heart Rate (bpm): 143   Movement: Present     General:  Alert, oriented and cooperative. Patient is in no acute distress.  Skin: Skin is warm and dry. No rash noted.   Cardiovascular: Normal heart rate noted  Respiratory: Normal respiratory effort, no problems with respiration noted  Abdomen: Soft, gravid, appropriate for gestational age.  Pain/Pressure: Absent     Pelvic: Cervical exam deferred        Extremities: Normal range of motion.  Edema: None  Mental Status: Normal mood and affect. Normal behavior. Normal judgment and thought content.   Assessment and Plan:  Pregnancy: G1P0000 at [redacted]w[redacted]d  1. Encounter for supervision of normal first pregnancy in second trimester - LOB - S/p normal anatomy scan 10/07/2020 - Desires waterbirth, reviewed risk criteria, requirements, advised considering doula for labor support  2. [redacted] weeks gestation of pregnancy  Preterm labor symptoms and general obstetric precautions including but not limited to vaginal bleeding, contractions, leaking of fluid and fetal movement were reviewed in detail with the patient. Please refer to  After Visit Summary for other counseling recommendations.  Return in about 4 weeks (around 11/10/2020).  Future Appointments  Date Time Provider Department Center  11/10/2020  3:50 PM Calvert Cantor, CNM [facility]    Calvert Cantor, [place]

## 2020-10-23 NOTE — L&D Delivery Note (Signed)
  Delivery Note Pt progressed steadily thorough labor, in the tub.  FHR aways 130s, no decels.  After a 10 minute 2nd stage, at 12:55 AM a viable female was delivered via Vaginal, Spontaneous (Presentation:OA), under water. , with mom in hands and knees position.  Shoulders were assisted using normal maneuvers, baby passed in between mom's legs and out of water without difficulty.  Immediate spontaneous cry..  APGAR: 8, 9; weight pending.  After 10 minutes, pt and baby assisted out of tub.  Placenta status: Spontaneous, Intact.  Cord: 3 vessels with the following complications: None.  After delivery of placenta, cord clamped and cut by FOB.  Anesthesia: none  Episiotomy:  none Lacerations:  none Suture Repair:  Est. Blood Loss (mL):  250  Mom to postpartum.  Baby to Couplet care / Skin to Skin.  Scarlette Calico Cresenzo-Dishmon 02/18/2021, 1:37 AM

## 2020-11-04 ENCOUNTER — Telehealth: Payer: Self-pay | Admitting: Radiology

## 2020-11-04 NOTE — Telephone Encounter (Signed)
Left message for patient to call office about switching 11/10/20 visit to a virtual visit.

## 2020-11-10 ENCOUNTER — Other Ambulatory Visit: Payer: Self-pay

## 2020-11-10 ENCOUNTER — Telehealth (INDEPENDENT_AMBULATORY_CARE_PROVIDER_SITE_OTHER): Payer: BC Managed Care – PPO | Admitting: Advanced Practice Midwife

## 2020-11-10 VITALS — BP 104/65 | HR 75

## 2020-11-10 DIAGNOSIS — Z3A25 25 weeks gestation of pregnancy: Secondary | ICD-10-CM

## 2020-11-10 DIAGNOSIS — Z3402 Encounter for supervision of normal first pregnancy, second trimester: Secondary | ICD-10-CM

## 2020-11-10 NOTE — Progress Notes (Signed)
   OBSTETRICS PRENATAL VIRTUAL VISIT ENCOUNTER NOTE  Provider location: Center for [facility]   I connected with Denise Wu on 11/10/20 at  3:50 PM EST by MyChart Video Encounter at home and verified that I am speaking with the correct person using two identifiers.   I discussed the limitations, risks, security and privacy concerns of performing an evaluation and management service virtually and the availability of in person appointments. I also discussed with the patient that there may be a patient responsible charge related to this service. The patient expressed understanding and agreed to proceed. Subjective:  Denise Wu is a 25 y.o. G1P0000 at [redacted]w[redacted]d being seen today for ongoing prenatal care.  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  Patient reports no complaints. She started leaking colostrum about two weeks ago and wants to make sure that isn't a concern.  Contractions: Not present. Vag. Bleeding: None.  Movement: Present. Denies any leaking of fluid.   The following portions of the patient's history were reviewed and updated as appropriate: allergies, current medications, past family history, past medical history, past social history, past surgical history and problem list.   Objective:   Vitals:   11/10/20 1554  BP: 104/65  Pulse: 75    Fetal Status:     Movement: Present     General:  Alert, oriented and cooperative. Patient is in no acute distress.  Respiratory: Normal respiratory effort, no problems with respiration noted  Mental Status: Normal mood and affect. Normal behavior. Normal judgment and thought content.  Rest of physical exam deferred due to type of encounter  Imaging: No results found.  Assessment and Plan:  Pregnancy: G1P0000 at [redacted]w[redacted]d 1. Encounter for  supervision of normal first pregnancy in second trimester - Reassured leaking of colostrum is normal - Preemptive teaching GTT next visit  2. [redacted] weeks gestation of pregnancy   Preterm labor symptoms and general obstetric precautions including but not limited to vaginal bleeding, contractions, leaking of fluid and fetal movement were reviewed in detail with the patient. I discussed the assessment and treatment plan with the patient. The patient was provided an opportunity to ask questions and all were answered. The patient agreed with the plan and demonstrated an understanding of the instructions. The patient was advised to call back or seek an in-person office evaluation/go to MAU at [facility] for any urgent or concerning symptoms. Please refer to After Visit Summary for other counseling recommendations.   I provided ten minutes of face-to-face time during this encounter.  No follow-ups on file.  Future Appointments  Date Time Provider Department Center  12/08/2020 10:15 AM Reva Bores, MD [facility]    Calvert Cantor, CNM Center for [employer], [facility]

## 2020-12-07 DIAGNOSIS — Z20822 Contact with and (suspected) exposure to covid-19: Secondary | ICD-10-CM | POA: Diagnosis not present

## 2020-12-07 DIAGNOSIS — U071 COVID-19: Secondary | ICD-10-CM | POA: Diagnosis not present

## 2020-12-08 ENCOUNTER — Other Ambulatory Visit: Payer: Self-pay

## 2020-12-08 ENCOUNTER — Telehealth: Payer: BC Managed Care – PPO | Admitting: Family Medicine

## 2020-12-08 ENCOUNTER — Telehealth (INDEPENDENT_AMBULATORY_CARE_PROVIDER_SITE_OTHER): Payer: BC Managed Care – PPO | Admitting: Family Medicine

## 2020-12-08 DIAGNOSIS — Z3403 Encounter for supervision of normal first pregnancy, third trimester: Secondary | ICD-10-CM

## 2020-12-08 DIAGNOSIS — Z3A29 29 weeks gestation of pregnancy: Secondary | ICD-10-CM

## 2020-12-08 DIAGNOSIS — O98513 Other viral diseases complicating pregnancy, third trimester: Secondary | ICD-10-CM

## 2020-12-08 DIAGNOSIS — U071 COVID-19: Secondary | ICD-10-CM

## 2020-12-08 NOTE — Patient Instructions (Signed)

## 2020-12-08 NOTE — Progress Notes (Signed)
I connected with  Denise Wu on 12/08/20 at 11:30 AM EST by telephone and verified that I am speaking with the correct person using two identifiers.   I discussed the limitations, risks, security and privacy concerns of performing an evaluation and management service by telephone and the availability of in person appointments. I also discussed with the patient that there may be a patient responsible charge related to this service. The patient expressed understanding and agreed to proceed.  Scheryl Marten, RN 12/08/2020  11:32 AM   currently has covid

## 2020-12-08 NOTE — Progress Notes (Signed)
    OBSTETRICS PRENATAL VIRTUAL VISIT ENCOUNTER NOTE  Provider location: [facility] for [facility]   I connected with Denise Wu on 12/08/20 at 11:30 AM EST by MyChart Video Encounter at home and verified that I am speaking with the correct person using two identifiers.   I discussed the limitations, risks, security and privacy concerns of performing an evaluation and management service virtually and the availability of in person appointments. I also discussed with the patient that there may be a patient responsible charge related to this service. The patient expressed understanding and agreed to proceed. Subjective:  Denise Wu is a 25 y.o. G1P0000 at [redacted]w[redacted]d being seen today for ongoing prenatal care.  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  Patient reports recent dx of COVID, feeling some better after tylenol due to body aches.  Contractions: Not present. Vag. Bleeding: None.  Movement: Present. Denies any leaking of fluid.   The following portions of the patient's history were reviewed and updated as appropriate: allergies, current medications, past family history, past medical history, past social history, past surgical history and problem list.   Objective:  There were no vitals filed for this visit.  Fetal Status:     Movement: Present     General:  Alert, oriented and cooperative. Patient is in no acute distress.  Respiratory: Normal respiratory effort, no problems with respiration noted  Mental Status: Normal mood and affect. Normal behavior. Normal judgment and thought content.  Rest of physical exam deferred due to type of encounter  Imaging: No results found.  Assessment and Plan:  Pregnancy: G1P0000 at [redacted]w[redacted]d 1. Encounter for supervision of normal first pregnancy  in third trimester Continue routine prenatal care. Needs 2 hour  2. COVID-19 Given list of OTC meds to take, symptomatic control, reasons to go to UC or hospital.  Preterm labor symptoms and general obstetric precautions including but not limited to vaginal bleeding, contractions, leaking of fluid and fetal movement were reviewed in detail with the patient. I discussed the assessment and treatment plan with the patient. The patient was provided an opportunity to ask questions and all were answered. The patient agreed with the plan and demonstrated an understanding of the instructions. The patient was advised to call back or seek an in-person office evaluation/go to MAU at [facility] for any urgent or concerning symptoms. Please refer to After Visit Summary for other counseling recommendations.   I provided 11 minutes of face-to-face time during this encounter.  Return in 2 weeks (on 12/22/2020) for [facility], 28 wk labs, in person.  Future Appointments  Date Time Provider Department Center  12/22/2020  2:50 PM Calvert Cantor, CNM [facility]    Reva Bores, MD Center for [facility], [facility]

## 2020-12-14 ENCOUNTER — Other Ambulatory Visit: Payer: Self-pay

## 2020-12-14 ENCOUNTER — Other Ambulatory Visit: Payer: BC Managed Care – PPO

## 2020-12-14 VITALS — BP 104/66 | HR 73 | Wt 141.6 lb

## 2020-12-14 DIAGNOSIS — Z3403 Encounter for supervision of normal first pregnancy, third trimester: Secondary | ICD-10-CM | POA: Diagnosis not present

## 2020-12-14 NOTE — Progress Notes (Unsigned)
Lab only visit, pt requesting vitals

## 2020-12-15 LAB — CBC
Hematocrit: 35.7 % (ref 34.0–46.6)
Hemoglobin: 12 g/dL (ref 11.1–15.9)
MCH: 30.8 pg (ref 26.6–33.0)
MCHC: 33.6 g/dL (ref 31.5–35.7)
MCV: 92 fL (ref 79–97)
Platelets: 278 10*3/uL (ref 150–450)
RBC: 3.89 x10E6/uL (ref 3.77–5.28)
RDW: 11.7 % (ref 11.7–15.4)
WBC: 8.5 10*3/uL (ref 3.4–10.8)

## 2020-12-15 LAB — GLUCOSE TOLERANCE, 2 HOURS W/ 1HR
Glucose, 1 hour: 111 mg/dL (ref 65–179)
Glucose, 2 hour: 85 mg/dL (ref 65–152)
Glucose, Fasting: 70 mg/dL (ref 65–91)

## 2020-12-15 LAB — RPR: RPR Ser Ql: NONREACTIVE

## 2020-12-15 LAB — HIV ANTIBODY (ROUTINE TESTING W REFLEX): HIV Screen 4th Generation wRfx: NONREACTIVE

## 2020-12-22 ENCOUNTER — Other Ambulatory Visit: Payer: Self-pay

## 2020-12-22 ENCOUNTER — Ambulatory Visit (INDEPENDENT_AMBULATORY_CARE_PROVIDER_SITE_OTHER): Payer: BC Managed Care – PPO | Admitting: Advanced Practice Midwife

## 2020-12-22 VITALS — BP 115/72 | HR 68 | Wt 144.4 lb

## 2020-12-22 DIAGNOSIS — Z3A31 31 weeks gestation of pregnancy: Secondary | ICD-10-CM

## 2020-12-22 DIAGNOSIS — Z3403 Encounter for supervision of normal first pregnancy, third trimester: Secondary | ICD-10-CM

## 2020-12-22 NOTE — Patient Instructions (Signed)
Considering Waterbirth? Guide for [facility] at [facility] Why consider waterbirth? . Gentle birth for babies  . Less pain medicine used in labor  . May allow for passive descent/less pushing  . May reduce perineal tears  . More mobility and instinctive maternal position changes  . Increased maternal relaxation   Is waterbirth safe? What are the risks of infection, drowning or other complications? . Infection:  . Very low risk (3.7 % for tub vs 4.8% for bed)  . 7 in 8000 waterbirths with documented infection  . Poorly cleaned equipment most common cause  . Slightly lower group B strep transmission rate  . Drowning  . Maternal:  . Very low risk  . Related to seizures or fainting  . Newborn:  . Very low risk. No evidence of increased risk of respiratory problems in multiple large studies  . Physiological protection from breathing under water  . Avoid underwater birth if there are any fetal complications  . Once baby's head is out of the water, keep it out.  . Birth complication  . Some reports of cord trauma, but risk decreased by bringing baby to surface gradually  . No evidence of increased risk of shoulder dystocia. Mothers can usually change positions faster in water than in a bed, possibly aiding the maneuvers to free the shoulder.   There are 2 things you MUST do to have a waterbirth with [facility]: 1. Attend a waterbirth class at [facility]   a. 3rd Wednesday of every month from 7-9 pm (virtual during COVID) b. Free c. Register online at [email or web address] or by calling [phone] d. Bring us the certificate from the class to your prenatal appointment or send via MyChart 2. Meet with a midwife at 36 weeks* to see if you can still plan a waterbirth and to sign the consent.   *We also recommend that you schedule as many of your prenatal visits with a midwife as possible.    Helpful  information: . You may want to bring a bathing suit top to the hospital to wear during labor but this is optional.  All other supplies are provided by the hospital. . Please arrive at the hospital with signs of active labor, and do not wait at home until late in labor. It takes 45 min- 2 hours for COVID testing, fetal monitoring, and check in to your room to take place, plus transport and filling of the waterbirth tub.    Things that would prevent you from having a waterbirth: . Unknown or Positive COVID-19 diagnosis upon admission to hospital* . Premature, <37wks  . Previous cesarean birth  . Presence of thick meconium-stained fluid  . Multiple gestation (Twins, triplets, etc.)  . Uncontrolled diabetes or gestational diabetes requiring medication  . Hypertension diagnosed in pregnancy or preexisting hypertension (gestational hypertension, preeclampsia, or chronic hypertension) . Heavy vaginal bleeding  . Non-reassuring fetal heart rate  . Active infection (MRSA, etc.). Group B Strep is NOT a contraindication for waterbirth.  . If your labor has to be induced and induction method requires continuous monitoring of the baby's heart rate  . Other risks/issues identified by your obstetrical provider   Please remember that birth is unpredictable. Under certain unforeseeable circumstances your provider may advise against giving birth in the tub. These decisions will be made on a case-by-case basis and with the safety of you and your baby as our highest priority.   *Please remember that in   order to have a waterbirth, you must test Negative to COVID-19 upon admission to the hospital.  Updated 09/07/2020  

## 2020-12-23 NOTE — Progress Notes (Signed)
   PRENATAL VISIT NOTE  Subjective:  Denise Wu is a 25 y.o. G1P0000 at [redacted]w[redacted]d being seen today for ongoing prenatal care.  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  Patient reports no complaints.  Contractions: Not present. Vag. Bleeding: None.  Movement: Present. Denies leaking of fluid.   The following portions of the patient's history were reviewed and updated as appropriate: allergies, current medications, past family history, past medical history, past social history, past surgical history and problem list. Problem list updated.  Objective:   Vitals:   12/22/20 1454  BP: 115/72  Pulse: 68  Weight: 144 lb 6.4 oz (65.5 kg)    Fetal Status: Fetal Heart Rate (bpm): 132 Fundal Height: 29 cm Movement: Present     General:  Alert, oriented and cooperative. Patient is in no acute distress.  Skin: Skin is warm and dry. No rash noted.   Cardiovascular: Normal heart rate noted  Respiratory: Normal respiratory effort, no problems with respiration noted  Abdomen: Soft, gravid, appropriate for gestational age.  Pain/Pressure: Absent     Pelvic: Cervical exam deferred        Extremities: Normal range of motion.  Edema: None  Mental Status: Normal mood and affect. Normal behavior. Normal judgment and thought content.   Assessment and Plan:  Pregnancy: G1P0000 at [redacted]w[redacted]d  1. Encounter for supervision of normal first pregnancy in third trimester - FOR WATERBIRTH :) All requirements met, consent signed today - Patient prefers to avoid IV access in labor. Discussed convenience of saline lock since admission labs will be collected anyway. Pt agreeable to saline lock  2. [redacted] weeks gestation of pregnancy   Preterm labor symptoms and general obstetric precautions including but not limited to vaginal bleeding,  contractions, leaking of fluid and fetal movement were reviewed in detail with the patient. Please refer to After Visit Summary for other counseling recommendations.  Return in about 2 weeks (around 01/05/2021).  Future Appointments  Date Time Provider [place]  01/05/2021  4:10 PM Darlina Rumpf, [place] [facility]  01/19/2021  4:10 PM [place], Mervyn Skeeters, CNM [facility] [place], CNM

## 2021-01-05 ENCOUNTER — Ambulatory Visit (INDEPENDENT_AMBULATORY_CARE_PROVIDER_SITE_OTHER): Payer: BC Managed Care – PPO | Admitting: Advanced Practice Midwife

## 2021-01-05 ENCOUNTER — Other Ambulatory Visit: Payer: Self-pay

## 2021-01-05 VITALS — BP 110/74 | HR 94 | Wt 148.0 lb

## 2021-01-05 DIAGNOSIS — Z3A33 33 weeks gestation of pregnancy: Secondary | ICD-10-CM

## 2021-01-05 DIAGNOSIS — Z3403 Encounter for supervision of normal first pregnancy, third trimester: Secondary | ICD-10-CM

## 2021-01-05 NOTE — Patient Instructions (Signed)

## 2021-01-05 NOTE — Progress Notes (Signed)
   PRENATAL VISIT NOTE  Subjective:  Denise Wu is a 25 y.o. G1P0000 at [redacted]w[redacted]d being seen today for ongoing prenatal care.  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  Patient reports no complaints.  Contractions: Not present. Vag. Bleeding: None.  Movement: Present. Denies leaking of fluid.   The following portions of the patient's history were reviewed and updated as appropriate: allergies, current medications, past family history, past medical history, past social history, past surgical history and problem list. Problem list updated.  Objective:   Vitals:   01/05/21 1640  BP: 110/74  Pulse: 94  Weight: 148 lb (67.1 kg)    Fetal Status: Fetal Heart Rate (bpm): 157   Movement: Present     General:  Alert, oriented and cooperative. Patient is in no acute distress.  Skin: Skin is warm and dry. No rash noted.   Cardiovascular: Normal heart rate noted  Respiratory: Normal respiratory effort, no problems with respiration noted  Abdomen: Soft, gravid, appropriate for gestational age.  Pain/Pressure: Present     Pelvic: Cervical exam deferred        Extremities: Normal range of motion.  Edema: Trace  Mental Status: Normal mood and affect. Normal behavior. Normal judgment and thought content.   Assessment and Plan:  Pregnancy: G1P0000 at [redacted]w[redacted]d  1. Encounter for supervision of normal first pregnancy in third trimester - Routine care - Preemptive teaching 36 week swabs  2. [redacted] weeks gestation of pregnancy   Preterm labor symptoms and general obstetric precautions including but not limited to vaginal bleeding, contractions, leaking of fluid and fetal movement were reviewed in detail with the patient. Please refer to After Visit Summary for other counseling recommendations.  Return in about 3 weeks  (around 01/26/2021).  Future Appointments  Date Time Provider Department Center  01/19/2021  4:10 PM Madlyn Frankel [facility]  01/24/2021  4:00 PM [place], MD [facility]    Calvert Cantor, [place]

## 2021-01-13 ENCOUNTER — Ambulatory Visit (INDEPENDENT_AMBULATORY_CARE_PROVIDER_SITE_OTHER): Payer: BC Managed Care – PPO | Admitting: *Deleted

## 2021-01-13 ENCOUNTER — Other Ambulatory Visit: Payer: Self-pay

## 2021-01-13 DIAGNOSIS — Z3403 Encounter for supervision of normal first pregnancy, third trimester: Secondary | ICD-10-CM

## 2021-01-13 MED ORDER — CYCLOBENZAPRINE HCL 10 MG PO TABS: 10.0000 mg | ORAL_TABLET | Freq: Three times a day (TID) | ORAL | 2 refills | Status: DC | PRN

## 2021-01-13 NOTE — Progress Notes (Signed)
Pt complains of pain in lower right abdomen, that goes down her leg. Denies any vaginal bleeding, no LOF. Does have an increase in discharge, but not a change in her discharge. States babys movements are different and decreased. Placed on NST.   Discussed symptoms with Dr Shawnie Pons and she reviewed reactive tracing. Will send in Flexeril to see if that helps with pain.   Labor precautions given and explained if anything gets worse to please call the office. Pt verbalizes and understands.

## 2021-01-13 NOTE — Progress Notes (Signed)
Patient seen and assessed by nursing staff.  Agree with documentation and plan.  

## 2021-01-19 ENCOUNTER — Ambulatory Visit (INDEPENDENT_AMBULATORY_CARE_PROVIDER_SITE_OTHER): Payer: BC Managed Care – PPO | Admitting: Student

## 2021-01-19 ENCOUNTER — Other Ambulatory Visit: Payer: Self-pay

## 2021-01-19 VITALS — BP 107/69 | HR 78 | Wt 149.0 lb

## 2021-01-19 DIAGNOSIS — Z3A35 35 weeks gestation of pregnancy: Secondary | ICD-10-CM

## 2021-01-19 DIAGNOSIS — Z3403 Encounter for supervision of normal first pregnancy, third trimester: Secondary | ICD-10-CM

## 2021-01-19 NOTE — Patient Instructions (Signed)
AREA PEDIATRIC/FAMILY PRACTICE PHYSICIANS  [place] ([id number]) . [facility] o Chambliss, MD; Eniola, MD; Hale, MD; Hensel, MD; McDiarmid, MD; McIntyer, MD; Neal, MD; Walden, MD o [address] [place], [place] [address] o [phone] o Mon-Fri 8:30-12:30, 1:30-5:00 o Providers come to see babies at [facility] o Accepting Medicaid . [facility] o Limited providers who accept newborns: Koirala, MD; Morrow, MD; Wolters, MD o [address], [place], [place] [address] o [phone] o Mon-Fri 8:00-5:30 o Babies seen by providers at [facility] o Does NOT accept Medicaid o Please call early in hospitalization for appointment (limited availability)  . [facility] o Mulberry, MD o [address] [place], [place] [address] o [phone] o Mon, Tue, Thur, Fri 8:30-5:00, Wed 10:00-7:00 (closed 1-2pm) o Babies seen by [facility] providers o Accepting Medicaid . [facility] - Pediatrician o Rubin, MD o [address], [place], [place] [address] o [phone] o Mon-Fri 8:30-5:00, Sat 8:30-12:00 o Provider comes to see babies at [facility] o Accepting Medicaid o Must have been referred from current patients or contacted office prior to delivery . [facility] ([facility]) o Brown, MD; Chandler, MD; Ettefagh, MD; Grant, MD; Lester, MD; McCormick, MD; McQueen, MD; Prose, MD; Simha, MD; Stanley, MD; Stryffeler, NP; Tebben, NP o [address], [place], [place] [address] o [phone] o Mon, Tue, Thur, Fri 8:30-5:30, Wed 9:30-5:30, Sat 8:30-12:30 o Babies seen by [facility] providers o Accepting Medicaid o Only accepting infants of first-time parents or siblings of current patients o Hospital discharge coordinator will make follow-up appointment . Jack Amos o [address],  [place], [place]  [address] o [phone]   Fax - [phone] . [facility] o [address], [place], [place]  [address] o Phone - [phone]   Fax - [phone] . Shilpa Gosrani o [address], [place], [place]  [address] o [phone]  [place] ([address]) . [facility] o Bates, MD; [place], MD; Cooper, Cox, MD; MD; Davis, MD; Dovico, MD; Ettefaugh, MD; Little, MD; Lowe, MD; Keiffer, MD; Melvin, MD; Sumner, MD; Williams, MD o [address], [place], [place] [address] o [phone] o Mon-Fri 8:30-5:00 (extended evenings Mon-Thur as needed), Sat-Sun 10:00-1:00 o Providers come to see babies at [facility] o Accepting Medicaid for families of first-time babies and families with all children in the household age 3 and under. Must register with office prior to making appointment (M-F only). . [facility] o Henson, NP; Knapp, MD; Lalonde, MD; Tysinger, PA o [address] [place], [place] [address] o [phone] o Mon-Fri 8:00-5:00 o Babies seen by providers at [facility] o Does NOT accept Medicaid/Commercial Insurance Only . [facility] ([facility])  o Artis, MD; Barnes, MD; Bratton, MD; Coccaro, MD; Lockett Gardner, MD; Kramer, MD; Marshall, MD; Netherton, MD; Poleto, MD; Skinner, MD o [address] [place], [place] [address] o [phone] o Mon-Fri 8:30-5:30, Sat (Oct.-Mar.) 9:00-1:00 o Babies seen by providers at [facility] o Accepting Medicaid  [place] ([address]) . [facility] o Reid, MD; Warner, MD o [address], [place],  [address] o [phone] o Mon-Fri 8:30-5:00, Sat 8:30-12:00 o Providers come to see babies at [facility] o Does NOT accept Medicaid . [facility] Cindy Hazy, PA; Tracie Harrier, MD; [place], [place]; Wynelle Link, MD; Azucena [place], MD o [address]  [place], [place] [address] o [phone] o Mon-Fri 8:00-5:00 o Babies seen by providers at [facility] o Does NOT accept Medicaid o Only accepting babies of parents who are patients o Please call early in hospitalization for appointment (limited availability) . [facility] Lamar Benes, MD; Abran Cantor, MD; Early Osmond, MD; Cherre Huger, NP; Hyacinth Meeker, MD; Dwan Bolt, MD; Jarold Motto, NP; Dario Guardian, MD; Talmage Nap, MD; Maisie Fus, MD; Pricilla Holm, MD; Tama High, MD o [address] [place], [place], [place] [address] o [phone] o Mon-Fri 8:00-5:00, Sat 9:00-12:00 o Providers come to see babies at [facility] o Does NOT accept [facility] [phone] & [phone]) . [facility] Alphonsa Overall, MD o [address] [place], [place] [address] o [phone] o Mon-Thur 8:00-6:00 o Providers come to see babies at [facility] o Accepting Medicaid . [facility] Zenon Mayo, NP; Cyndia Bent, MD; [place], [place]; [place], [place] o [address] [place], [place] [address] o [phone] o Mon-Thur 7:30-7:30, Fri 7:30-4:30 o Babies seen by [facility] providers o Accepting Medicaid . [facility] Cheryle Horsfall, MD; Janene Harvey, NP; Vonita Moss, MD o [address], [place], [place] [address] o [phone] o Mon-Fri 8:30-5:00, Sat 8:30-12:00 o Providers come to see babies at [facility] o Accepting Medicaid o Must have "Meet & Greet" appointment at office prior to delivery . [facility] [place] ([facility]) Llana Aliment, MD; Earlene Plater, MD; Lucretia Roers, MD o [address], [place], [place] [address] o [phone] o Mon-Wed 8:00-6:00, Thur-Fri 8:00-5:00, Sat 9:00-12:00 o Providers come to see babies at [facility] o Does NOT accept Medicaid o Only accepting siblings of current patients . [facility]  o [address], [place], [place]  [address] o [phone]   Fax -  [phone] . [facility] o [phone] N[address] [place], [place]  [address] o [phone]   Fax - [phone]  o caid [place] [phone]) . [facility] o Bushnell, DO; Lenise Arena, MD; [place], [place] o [address], [place], [place] [address] o [phone] o Mon-Fri 8:00-5:00 o Babies seen by providers at [facility] o Does NOT accept Medicaid o Limited appointment availability, please call early in hospitalization  . Nature conservation officer at [facility] o Stanford, DO; [place], MD o [address], [place], [place] [address] o [phone] o Mon-Fri 8:00-5:00 o Babies seen by [facility] providers o Does NOT accept Medicaid . [facility] Lorrine Kin, MD; Ninetta Lights, MD; [place], [place]; Hamilton, MD o [address], [place], [place] [address] o [phone] o Mon-Fri 8:00-5:00 o After hours clinic [facility] [place], [place] [address]) [phone] Mon-Fri 5:00-8:00, Sat 12:00-6:00, Sun 10:00-4:00 o Babies seen by [facility] providers o Accepting Medicaid . [facility] o [address], [place], [place]  [address] o [phone]   Fax - [phone]  [place] [phone]) . Nature conservation officer at [facility], MD o [address] [place], [place], [place] [address] o [phone] o Mon-Fri 8:00-5:00 o Babies seen by [facility] providers o Does NOT accept Medicaid . [facility] [place] [facility] [place] [facility]) Tomi Likens, MD o [address] [place] [address] [place], [place] [address] o [phone] o Mon-Thur 8:00-7:00, Fri 8:00-5:00, Sat  8:00-12:00 o Babies seen by providers at [facility] o Accepting Medicaid - but does not have vaccinations in office (must be received elsewhere) o Limited availability, please call early in hospitalization  [place] ([address]) . [facility]  o Wyvonne Lenz,  MD o [address] [place] [address] o [phone]  Fax [phone]

## 2021-01-19 NOTE — Progress Notes (Signed)
   PRENATAL VISIT NOTE  Subjective:  Denise Wu is a 25 y.o. G1P0000 at [redacted]w[redacted]d being seen today for ongoing prenatal care.  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  Patient reports no complaints. She does not know what she wants to do for [facility] yet; is not sure if she is having any more kids. She has not found a pediatrician. She still wants a WB.   Contractions: Not present. Vag. Bleeding: None.  Movement: Present. Denies leaking of fluid.   The following portions of the patient's history were reviewed and updated as appropriate: allergies, current medications, past family history, past medical history, past social history, past surgical history and problem list.   Objective:   Vitals:   01/19/21 1632  BP: 107/69  Pulse: 78  Weight: 149 lb (67.6 kg)    Fetal Status: Fetal Heart Rate (bpm): 135 Fundal Height: 33 cm Movement: Present     General:  Alert, oriented and cooperative. Patient is in no acute distress.  Skin: Skin is warm and dry. No rash noted.   Cardiovascular: Normal heart rate noted  Respiratory: Normal respiratory effort, no problems with respiration noted  Abdomen: Soft, gravid, appropriate for gestational age.  Pain/Pressure: Present     Pelvic: Cervical exam deferred        Extremities: Normal range of motion.  Edema: Trace  Mental Status: Normal mood and affect. Normal behavior. Normal judgment and thought content.   Assessment and Plan:  Pregnancy: G1P0000 at [redacted]w[redacted]d 1. [redacted] weeks gestation of pregnancy   2. Encounter for supervision of normal first pregnancy in third trimester   -confirmed that patient did not do any genetic testing -still interested in WB, reviewed risk factors that could exclude waterbirth. She will upload her WB class certificate.  -patient will call  pediatricians in the area and bring in names; patient also given lists -anticipatory guidance for 36 week visit, including confirming presentation and GBS/GC CT given, all questions answered  Preterm labor symptoms and general obstetric precautions including but not limited to vaginal bleeding, contractions, leaking of fluid and fetal movement were reviewed in detail with the patient. Please refer to After Visit Summary for other counseling recommendations.   No follow-ups on file.  Future Appointments  Date Time Provider Department Center  01/24/2021  4:00 PM [place] Bing, MD [facility]  02/02/2021 11:10 AM Calvert Cantor, CNM [facility]  02/08/2021  2:30 PM Anyanwu, Jethro Bastos, MD [facility]  02/16/2021 10:50 AM Crisoforo Oxford, Charlesetta Garibaldi, CNM [facility]    Marylene Land, CNM

## 2021-01-24 ENCOUNTER — Other Ambulatory Visit (HOSPITAL_COMMUNITY)
Admission: RE | Admit: 2021-01-24 | Discharge: 2021-01-24 | Disposition: A | Discharge status: Home / Self Care | Payer: BC Managed Care – PPO | Source: Ambulatory Visit | Attending: Obstetrics and Gynecology | Admitting: Obstetrics and Gynecology

## 2021-01-24 ENCOUNTER — Other Ambulatory Visit: Payer: Self-pay

## 2021-01-24 ENCOUNTER — Ambulatory Visit (INDEPENDENT_AMBULATORY_CARE_PROVIDER_SITE_OTHER): Payer: BC Managed Care – PPO | Admitting: Obstetrics and Gynecology

## 2021-01-24 VITALS — BP 116/75 | HR 75 | Wt 151.0 lb

## 2021-01-24 DIAGNOSIS — Z3403 Encounter for supervision of normal first pregnancy, third trimester: Secondary | ICD-10-CM | POA: Diagnosis not present

## 2021-01-24 DIAGNOSIS — Z3A36 36 weeks gestation of pregnancy: Secondary | ICD-10-CM

## 2021-01-24 DIAGNOSIS — Z8616 Personal history of COVID-19: Secondary | ICD-10-CM

## 2021-01-24 NOTE — Progress Notes (Signed)
   PRENATAL VISIT NOTE  Subjective:  Denise Wu is a 25 y.o. G1P0000 at [redacted]w[redacted]d being seen today for ongoing prenatal care.  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  Patient reports no complaints.  Contractions: Irritability. Vag. Bleeding: None.  Movement: Present. Denies leaking of fluid.   The following portions of the patient's history were reviewed and updated as appropriate: allergies, current medications, past family history, past medical history, past social history, past surgical history and problem list.   Objective:   Vitals:   01/24/21 1558  BP: 116/75  Pulse: 75  Weight: 151 lb (68.5 kg)    Fetal Status: Fetal Heart Rate (bpm): 136 Fundal Height: 35 cm Movement: Present  Presentation: Vertex  General:  Alert, oriented and cooperative. Patient is in no acute distress.  Skin: Skin is warm and dry. No rash noted.   Cardiovascular: Normal heart rate noted  Respiratory: Normal respiratory effort, no problems with respiration noted  Abdomen: Soft, gravid, appropriate for gestational age.  Pain/Pressure: Present     Pelvic: Cervical exam performed in the presence of a chaperone Dilation: 1 Effacement (%): 0 Station: Ballotable  Extremities: Normal range of motion.  Edema: Trace  Mental Status: Normal mood and affect. Normal behavior. Normal judgment and thought content.   Assessment and Plan:  Pregnancy: G1P0000 at [redacted]w[redacted]d 1. Encounter for supervision of normal first pregnancy in third trimester Routine care - Strep Gp B NAA - GC/Chlamydia probe amp ([place])not at [facility]   Preterm labor symptoms and general obstetric precautions including but not limited to vaginal bleeding, contractions, leaking of fluid and fetal movement were reviewed in detail with the patient. Please refer to After Visit  Summary for other counseling recommendations.   Return in about 1 week (around 01/31/2021) for 7-10d, low risk, in person, md or app.  Future Appointments  Date Time Provider Department Center  02/02/2021 11:10 AM Calvert Cantor, CNM [facility]  02/08/2021  2:30 PM Anyanwu, Jethro Bastos, MD [facility]  02/16/2021 10:50 AM Crisoforo Oxford, Charlesetta Garibaldi, CNM [facility]    [place] Bing, MD

## 2021-01-26 LAB — GC/CHLAMYDIA PROBE AMP (~~LOC~~) NOT AT ARMC
Chlamydia: NEGATIVE
Comment: NEGATIVE
Comment: NORMAL
Neisseria Gonorrhea: NEGATIVE

## 2021-01-26 LAB — STREP GP B NAA: Strep Gp B NAA: NEGATIVE

## 2021-02-02 ENCOUNTER — Ambulatory Visit (INDEPENDENT_AMBULATORY_CARE_PROVIDER_SITE_OTHER): Payer: BC Managed Care – PPO | Admitting: Advanced Practice Midwife

## 2021-02-02 ENCOUNTER — Other Ambulatory Visit: Payer: Self-pay

## 2021-02-02 VITALS — BP 119/77 | HR 77 | Wt 151.0 lb

## 2021-02-02 DIAGNOSIS — Z3A37 37 weeks gestation of pregnancy: Secondary | ICD-10-CM

## 2021-02-02 DIAGNOSIS — Z3403 Encounter for supervision of normal first pregnancy, third trimester: Secondary | ICD-10-CM

## 2021-02-02 NOTE — Patient Instructions (Signed)
First Stage of Labor Labor is your body's natural process of moving your baby and other structures, including the placenta and umbilical cord, out of your uterus. There are three stages of labor. How long each stage lasts is different for every woman. But certain events happen during each stage that are the same for everyone.  The first stage starts when true labor begins. This stage ends when your cervix, which is the opening from your uterus into your vagina, is completely open (dilated).  The second stage begins when your cervix is fully dilated and you start pushing. This stage ends when your baby is born.  The third stage is the delivery of the organ that nourished your baby during pregnancy (placenta). First stage of labor As your due date gets closer, you may start to notice certain physical changes that mean labor is going to start soon. You may feel that your baby has dropped lower into your pelvis. You may experience irregular, often painless, contractions that go away when you walk around or lie down (Braxton Hicks contractions). This is also called false labor. The first stage of labor begins when you start having contractions that come at regular (evenly spaced) intervals and your cervix starts to get thinner and wider in preparation for your baby to pass through. Birth care providers measure the dilation of your cervix in centimeters (cm). One centimeter is a little less than one-half of an inch. The first stage ends when your cervix is dilated to 10 cm. The first stage of labor is divided into three phases:  Early phase.  Active phase.  Transitional phase. The length of the first stage of labor varies. It may be longer if this is your first pregnancy. You may spend most of this stage at home trying to relax and stay comfortable. How does this affect me? During the first stage of labor, you will move through three phases. What happens in the early phase?  You will start to have  regular contractions that last 30-60 seconds. Contractions may come every 5-20 minutes. Keep track of your contractions and call your birth care provider.  Your water may break during this phase.  You may notice a clear or slightly bloody discharge of mucus (mucus plug) from your vagina.  Your cervix will dilate to 3-6 cm. What happens in the active phase? The active phase usually lasts 3-5 hours. You may go to the hospital or birth center around this time. During the active phase:  Your contractions will become stronger, longer, and more uncomfortable.  Your contractions may last 45-90 seconds and come every 3-5 minutes.  You may feel lower back pain.  Your birth care providers may examine your cervix and feel your belly to find the position of your baby.  You may have a monitor strapped to your belly to measure your contractions and your baby's heart rate.  You may start using your pain management options.  Your cervix may be dilated to 6 cm and may start to dilate more quickly. What happens in the transitional phase? The transitional phase typically lasts from 30 minutes to 2 hours. At the end of this phase, your cervix will be fully dilated to 10 cm. During the transitional phase:  Contractions will get stronger and longer.  Contractions may last 60-90 seconds and come less than 2 minutes apart.  You may feel hot flashes, chills, or nausea. How does this affect my baby? During the first stage of labor, your baby will   gradually move down into your birth canal. Follow these instructions at home and in the hospital or birth center:  When labor first begins, try to stay calm. You are still in the early phase. If it is night, try to get some sleep. If it is day, try to relax and save your energy. You may want to make some calls and get ready to go to the hospital or birth center.  When you are in the early phase, try these methods to help ease discomfort: ? Deep breathing and  muscle relaxation. ? Taking a walk. ? Taking a warm bath or shower.  Drink some fluids and have a light snack if you feel like it.  Keep track of your contractions.  Based on the plan you created with your birth care provider, call when your contractions indicate it is time.  If your water breaks, note the time, color, and odor of the fluid.  When you are in the active phase, do your breathing exercises and rely on your support people and your team of birth care providers.   Contact a health care provider if:  Your contractions are strong and regular.  You have lower back pain or cramping.  Your water breaks.  You lose your mucus plug. Get help right away if you:  Have a severe headache that does not go away.  Have changes in your vision.  Have severe pain in your upper belly.  Do not feel the baby move.  Have bright red bleeding. Summary  The first stage of labor starts when true labor begins, and it ends when your cervix is dilated to 10 cm.  The first stage of labor has three phases: early, active, and transitional.  Your baby moves into the birth canal during the first stage of labor.  You may have contractions that become stronger and longer. You may also lose your mucus plug and have your water break.  Call your birth care provider when your contractions are frequent and strong enough to go to the hospital or birth center. This information is not intended to replace advice given to you by your health care provider. Make sure you discuss any questions you have with your health care provider. Document Revised: 01/30/2019 Document Reviewed: 12/23/2017 Elsevier Patient Education  2021 Elsevier Inc.  

## 2021-02-02 NOTE — Progress Notes (Signed)
   PRENATAL VISIT NOTE  Subjective:  Denise Wu is a 25 y.o. G1P0000 at [redacted]w[redacted]d being seen today for ongoing prenatal care.  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  Patient reports no complaints.  Contractions: Irritability. Vag. Bleeding: None.  Movement: Present. Denies leaking of fluid.   The following portions of the patient's history were reviewed and updated as appropriate: allergies, current medications, past family history, past medical history, past social history, past surgical history and problem list. Problem list updated.  Objective:   Vitals:   02/02/21 1120  BP: 119/77  Pulse: 77  Weight: 151 lb (68.5 kg)    Fetal Status: Fetal Heart Rate (bpm): 134 Fundal Height: 36 cm Movement: Present     General:  Alert, oriented and cooperative. Patient is in no acute distress.  Skin: Skin is warm and dry. No rash noted.   Cardiovascular: Normal heart rate noted  Respiratory: Normal respiratory effort, no problems with respiration noted  Abdomen: Soft, gravid, appropriate for gestational age.  Pain/Pressure: Present     Pelvic: Cervical exam deferred        Extremities: Normal range of motion.     Mental Status: Normal mood and affect. Normal behavior. Normal judgment and thought content.   Assessment and Plan:  Pregnancy: G1P0000 at [redacted]w[redacted]d  1. Encounter for supervision of normal first pregnancy in third trimester - LOB - Reviewed labor triage process, 5/1/1 guidance, delayed cord clamping, rooming in - Discussed EPO, RRL tea, dates for cervical ripening - For waterbirth, all requirements met  2. [redacted] weeks gestation of pregnancy   Term labor symptoms and general obstetric precautions including but not limited to vaginal bleeding, contractions, leaking of fluid and fetal movement were reviewed in  detail with the patient. Please refer to After Visit Summary for other counseling recommendations.    Future Appointments  Date Time Provider [place]  02/08/2021  2:30 PM Anyanwu, Sallyanne Havers, MD [facility]  02/16/2021 10:50 AM Ardean Larsen, Mervyn Skeeters, CNM [facility] [place], CNM

## 2021-02-08 ENCOUNTER — Other Ambulatory Visit: Payer: Self-pay

## 2021-02-08 ENCOUNTER — Ambulatory Visit (INDEPENDENT_AMBULATORY_CARE_PROVIDER_SITE_OTHER): Payer: BC Managed Care – PPO | Admitting: Obstetrics & Gynecology

## 2021-02-08 VITALS — BP 117/79 | HR 68 | Wt 155.4 lb

## 2021-02-08 DIAGNOSIS — Z3403 Encounter for supervision of normal first pregnancy, third trimester: Secondary | ICD-10-CM

## 2021-02-08 DIAGNOSIS — Z3A38 38 weeks gestation of pregnancy: Secondary | ICD-10-CM

## 2021-02-08 NOTE — Patient Instructions (Signed)
Return to office for any scheduled appointments. Call the office or go to the [facility] at [facility] [place] if:  You begin to have strong, frequent contractions  Your water breaks.  Sometimes it is a big gush of fluid, sometimes it is just a trickle that keeps getting your panties wet or running down your legs  You have vaginal bleeding.  It is normal to have a small amount of spotting if your cervix was checked.   You do not feel your baby moving like normal.  If you do not, get something to eat and drink and lay down and focus on feeling your baby move.   If your baby is still not moving like normal, you should call the office or go to MAU.  Any other obstetric concerns.   

## 2021-02-08 NOTE — Progress Notes (Signed)
   PRENATAL VISIT NOTE  Subjective:  Denise Wu is a 25 y.o. G1P0000 at [redacted]w[redacted]d being seen today for ongoing prenatal care.  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  Patient reports no complaints.  Contractions: Irritability. Vag. Bleeding: None.  Movement: Present. Denies leaking of fluid.   The following portions of the patient's history were reviewed and updated as appropriate: allergies, current medications, past family history, past medical history, past social history, past surgical history and problem list.   Objective:   Vitals:   02/08/21 1434  BP: 117/79  Pulse: 68  Weight: 155 lb 6.4 oz (70.5 kg)    Fetal Status: Fetal Heart Rate (bpm): 148 Fundal Height: 38 cm Movement: Present     General:  Alert, oriented and cooperative. Patient is in no acute distress.  Skin: Skin is warm and dry. No rash noted.   Cardiovascular: Normal heart rate noted  Respiratory: Normal respiratory effort, no problems with respiration noted  Abdomen: Soft, gravid, appropriate for gestational age.  Pain/Pressure: Present     Pelvic: Cervical exam deferred        Extremities: Normal range of motion.     Mental Status: Normal mood and affect. Normal behavior. Normal judgment and thought content.   Assessment and Plan:  Pregnancy: G1P0000 at [redacted]w[redacted]d 1. [redacted] weeks gestation of pregnancy 2. Encounter for supervision of normal first pregnancy in third trimester GBS negative. Has met criteria for waterbirth as per note by Maryelizabeth Kaufmann, CNM. No other concerns.  Term labor symptoms and general obstetric precautions including but not limited to vaginal bleeding, contractions, leaking of fluid and fetal movement were reviewed in detail with the patient. Please refer to After Visit Summary for other counseling recommendations.    Return in about 1 week (around 02/15/2021) for OFFICE OB VISIT (CNM).  Future Appointments  Date Time Provider [place]  02/16/2021 10:50 AM Ardean Larsen, Mervyn Skeeters, CNM [facility]    Verita Schneiders, MD

## 2021-02-16 ENCOUNTER — Other Ambulatory Visit: Payer: Self-pay

## 2021-02-16 ENCOUNTER — Ambulatory Visit (INDEPENDENT_AMBULATORY_CARE_PROVIDER_SITE_OTHER): Payer: BC Managed Care – PPO | Admitting: Student

## 2021-02-16 VITALS — BP 129/84 | HR 70 | Wt 153.4 lb

## 2021-02-16 DIAGNOSIS — Z3403 Encounter for supervision of normal first pregnancy, third trimester: Secondary | ICD-10-CM

## 2021-02-16 DIAGNOSIS — Z3A39 39 weeks gestation of pregnancy: Secondary | ICD-10-CM

## 2021-02-16 NOTE — Progress Notes (Signed)
PRENATAL VISIT NOTE  Subjective:  Denise Wu is a 25 y.o. G1P0000 at [redacted]w[redacted]d being seen today for ongoing prenatal care.  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  Patient reports no complaints. She does not want IOL.  Contractions: Irritability. Vag. Bleeding: Scant.  Movement: Present. Denies leaking of fluid.   The following portions of the patient's history were reviewed and updated as appropriate: allergies, current medications, past family history, past medical history, past social history, past surgical history and problem list.   Objective:   Vitals:   02/16/21 1057  BP: 129/84  Pulse: 70  Weight: 153 lb 6.4 oz (69.6 kg)    Fetal Status: Fetal Heart Rate (bpm): 145 Fundal Height: 37 cm Movement: Present     General:  Alert, oriented and cooperative. Patient is in no acute distress.  Skin: Skin is warm and dry. No rash noted.   Cardiovascular: Normal heart rate noted  Respiratory: Normal respiratory effort, no problems with respiration noted  Abdomen: Soft, gravid, appropriate for gestational age.  Pain/Pressure: Present     Pelvic: Cervical exam deferred        Extremities: Normal range of motion.  Edema: Mild pitting, slight indentation  Mental Status: Normal mood and affect. Normal behavior. Normal judgment and thought content.   Assessment and Plan:  Pregnancy: G1P0000 at [redacted]w[redacted]d 1. Encounter for supervision of normal first pregnancy in third trimester   2. [redacted] weeks gestation of pregnancy    -still working on pediatrician; she has a list -reviewed WB information; reviewed exclusion criteria and patient has no questions about WB -some bloody show today, declines cervical exam -patient does not want IOL at 41 weeks; she declines to be scheduled. She also does not want to schedule appt with  provider after 40 weeks or 41 weeks. Discussed with patient the increased risk for stillbirth; advised patient to do daily kick counts. Patient states that she feels baby will come when ready and she has the kick count information at home. Advised her to go to MAU if any warning signs, concerns for movement, bleeding -Patient agrees to NST/AFI next week, but did not want to be scheduled for anything after that.  -Patient does not want to go to [facility] for MFM BPP/NST; confirmed with Dr. Alvester Morin and Diane that testing can be done at [facility]. Will send a message to clinic pool to call patient at 41 weeks and discuss with her the following steps.   Term labor symptoms and general obstetric precautions including but not limited to vaginal bleeding, contractions, leaking of fluid and fetal movement were reviewed in detail with the patient. Please refer to After Visit Summary for other counseling recommendations.   Return in about 1 week (around 02/23/2021), or needs NST/AFI next week and NST next week, then provider visit and NST/AFI in two weeks..  Future Appointments  Date Time Provider Department Center  02/23/2021 11:00 AM [facility] NURSE [facility]    Charlesetta Garibaldi [place], [place]  [facility] Guidelines for Antenatal Testing and Sonography                       Updated  01/10/2021 with Dr. Noralee Space  INDICATION Growth U/S BPP weekly DELIVERY RECOMMENDATION (GA)  Diabetes   A1 - good control     A2 - good control  A2  - poor control or poor compliance    (Macrosomia or polyhydramnios)     A2/B and B-C Pregestational Type II DM    Poor control B-C or D-R-F-T  or  Type I DM   28-32-36  28-32-36  24-28-32-36   24-28-32-36  24-27-30-33-36  None  32  32   32  28  39-40  39-39.6  37 or as per MFM (consider admission)  39  Admission/37 or earlier as per MFM  CHTN   Group I   BP < 140/90, no meds, no preeclampsia, AGA, nml AFV   Group II  BP > 140/90, on  meds, no preeclampsia, AGA, nml AFV  24-28-32-36  24-28-32-36  None  32  38-39.6  37-39  (36-37 or earlier for poor control)  Pre-eclampsia  GHTN or Preeclampsia without severe features   Preeclampsia with severe features   Q 4 wks  Q 2 wks  28  Inpatient  37-37.6  PRN or 34  IUGR    EFW < 10% +/- AC<10%,nml Dopplers & AFV, no other comorbidities        EFW < 10% +/- AC<10%, w/ abnormal UA dopplers   Q 3-4wks   As per MFM algorithm  At diagnosis (BPP+dopplers wkly)  As per MFM algorithm  38.0-39.0   As per MFM algorithm (see below)  Multiple Gestation    DC/DA  Concordant (<20%), nml AFV, AGA, no other comorbidities  Discordant (> 20%)     MC/DA   Concordant (< 20%), nml AFV, AGA    Discordant (>20%) or with Twin-twin Transfusion Syndrome (TTTS)    TRI/TRI Concordant growth    TTTS Surveillance   24-28-32-36 Q 2-3 wks   24-28-32-36 As per MFM  24-28-32  Limited scans Q2 weeks starting 16 weeks   36  28   32 As per MFM  32 (BPPs by MFM)   38 As per MFM    37 As per MFM  36 (uncomplicated) or as per MFM    Advanced Maternal Age ? 25 y.o.  20-32-36 36 39-40  Oligohydramnios (AFI ?5 or SDP <2)    AGA, nml anatomy, no other comorbidities  Q 4 wks  28  37 or as per MFM  Polyhydramnios, mild (AFI ?24-29.9 or SDP 8-11.9) Q 4 wks None 39-40  Polyhydramnios, moderate (AFI 30-34.9 or SDP 12-15.9) Q 4 wks 32 39-40  Polyhydramnios, severe (AFI ?35 or SDP ?16 ) Q 4 wks 32 37-39 (37 for severe maternal discomfort or as per MFM)  Cholestasis Q 4 wks 28 37 or as per MFM (36 for Bile acids ? 100)  Intrauterine Fetal Demise (>24 weeks) 24-28-32-36 32 39 (37-38 if history of abruption or severe maternal anxiety or as per MFM)  Chronic Renal Disease (Cr ?1.2, proteinuria) 24-28-32-36 32 37 or as per MFM  SLE (lupus) 24-28-32-36 32 39 (stable) or as per MFM if +flares/steroids  Antiphospholipid syndrome 24-28-32-36 32 39  Sickle Cell Disease  24-28-32-36 32 39-40 (if stable)  Hypothyroidism (poorly controlled)  24-28-32-36 32 As per MFM  Hyperthyroidism (on medications) 24-28-32-36 32 39 (well controlled) or as per MFM  HIV  28-36 None C/S at 38 (VL>1K) or IOL at ?39 (VL<1K)   Fetal cardiac anomaly 24-28-32-36 36 As per MFM  Fetal Trisomy 21 (confirmed)  24-28-32-36 32 As per MFM  Placenta Previa without hemorrhage 24-28-32-36 None 37  Placenta Previa with hemorrhage 24-28-32-36 At diagnosis 34 or as per MFM  Low lying placenta  without hemorrhage (until ruled out) 28-32-35 None N/A if ruled out or treat as previa  Chronic placental abruption As per MFM At diagnosis 37 or earlier as per MFM  Vasa Previa (managed inpatient) 24-28-32 32 34 or as per MFM  Velamentous cord insertion 24-28-32-36 36 N/A  Single umbilical artery (2VC) 24-28-32-36 None N/A  Marginal cord insertion 28-32-36 None N/A  Polysubstance Use 24-28-32-36 36 As per MFM  Methadone Use (stable) 28-32-36 None N/A  History of severe preeclampsia 32 As per MFM N/A  MSAFP? 3.0 MoM 24-28-32-36 32 As per MFM  Obesity (BMI ?35 and ?40) - O99.210 32 None 39-40  Morbid Obesity (BMI ?40) - O99.210, E66.01 24-28-32-36 36 39-40  IVF Pregnancy 32 None N/A  Decreased Fetal Movement- O36.8190  At diagnosis & PRN PRN  Postdates - O48.0 (schedule testing at [redacted]w[redacted]d or later if possible)  40 40-41       OTHER CLINICAL SCENARIOS AND RECOMMENDED DELIVERY TIMES *PPROM: [redacted]w[redacted]d-[redacted]w[redacted]d as per ACOG, patient should be counseled *Previous Myomectomy: If cavity entered, 37w, consider 36w if persistent pain/uterine contractions *Previous Classical Cesarean: 37w, consider 36w if persistent pain/uterine contractions *Previous Uterine Rupture:[redacted]w[redacted]d-[redacted]w[redacted]d, consider GA at time of rupture, may need to be done earlier *Placenta Accreta: [redacted]w[redacted]d-[redacted]w[redacted]d (with steroids)

## 2021-02-17 ENCOUNTER — Other Ambulatory Visit: Payer: Self-pay

## 2021-02-17 ENCOUNTER — Inpatient Hospital Stay (HOSPITAL_COMMUNITY)
Admission: AD | Admit: 2021-02-17 | Discharge: 2021-02-20 | DRG: 807 | Disposition: A | Discharge status: Home / Self Care | Payer: BC Managed Care – PPO | Attending: Family Medicine | Admitting: Family Medicine

## 2021-02-17 ENCOUNTER — Encounter (HOSPITAL_COMMUNITY): Payer: Self-pay | Admitting: Obstetrics & Gynecology

## 2021-02-17 DIAGNOSIS — O99334 Smoking (tobacco) complicating childbirth: Secondary | ICD-10-CM | POA: Diagnosis present

## 2021-02-17 DIAGNOSIS — Z3A39 39 weeks gestation of pregnancy: Secondary | ICD-10-CM

## 2021-02-17 DIAGNOSIS — Z3403 Encounter for supervision of normal first pregnancy, third trimester: Secondary | ICD-10-CM

## 2021-02-17 DIAGNOSIS — O26893 Other specified pregnancy related conditions, third trimester: Principal | ICD-10-CM | POA: Diagnosis present

## 2021-02-17 DIAGNOSIS — R030 Elevated blood-pressure reading, without diagnosis of hypertension: Secondary | ICD-10-CM | POA: Diagnosis present

## 2021-02-17 DIAGNOSIS — R7611 Nonspecific reaction to tuberculin skin test without active tuberculosis: Secondary | ICD-10-CM | POA: Diagnosis present

## 2021-02-17 DIAGNOSIS — Z8616 Personal history of COVID-19: Secondary | ICD-10-CM | POA: Diagnosis not present

## 2021-02-17 DIAGNOSIS — Z23 Encounter for immunization: Secondary | ICD-10-CM | POA: Diagnosis not present

## 2021-02-17 HISTORY — DX: Other specified health status: Z78.9

## 2021-02-17 LAB — PROTEIN / CREATININE RATIO, URINE
Creatinine, Urine: 111.92 mg/dL
Protein Creatinine Ratio: 0.15 mg/mg{Cre} (ref 0.00–0.15)
Total Protein, Urine: 17 mg/dL

## 2021-02-17 LAB — TYPE AND SCREEN
ABO/RH(D): O POS
Antibody Screen: NEGATIVE

## 2021-02-17 LAB — CBC
HCT: 36.9 % (ref 36.0–46.0)
Hemoglobin: 12.1 g/dL (ref 12.0–15.0)
MCH: 28.8 pg (ref 26.0–34.0)
MCHC: 32.8 g/dL (ref 30.0–36.0)
MCV: 87.9 fL (ref 80.0–100.0)
Platelets: 231 10*3/uL (ref 150–400)
RBC: 4.2 MIL/uL (ref 3.87–5.11)
RDW: 13 % (ref 11.5–15.5)
WBC: 10.1 10*3/uL (ref 4.0–10.5)
nRBC: 0 % (ref 0.0–0.2)

## 2021-02-17 LAB — COMPREHENSIVE METABOLIC PANEL
ALT: 12 U/L (ref 0–44)
AST: 19 U/L (ref 15–41)
Albumin: 3 g/dL — ABNORMAL LOW (ref 3.5–5.0)
Alkaline Phosphatase: 311 U/L — ABNORMAL HIGH (ref 38–126)
Anion gap: 9 (ref 5–15)
BUN: 10 mg/dL (ref 6–20)
CO2: 22 mmol/L (ref 22–32)
Calcium: 9 mg/dL (ref 8.9–10.3)
Chloride: 104 mmol/L (ref 98–111)
Creatinine, Ser: 0.6 mg/dL (ref 0.44–1.00)
GFR, Estimated: >60 mL/min (ref >60)
Glucose, Bld: 87 mg/dL (ref 70–99)
Potassium: 3.9 mmol/L (ref 3.5–5.1)
Sodium: 135 mmol/L (ref 135–145)
Total Bilirubin: 0.5 mg/dL (ref 0.3–1.2)
Total Protein: 6.8 g/dL (ref 6.5–8.1)

## 2021-02-17 LAB — RPR: RPR Ser Ql: NONREACTIVE

## 2021-02-17 MED ORDER — LACTATED RINGERS IV SOLN: INTRAVENOUS | Status: DC

## 2021-02-17 MED ORDER — ACETAMINOPHEN 325 MG PO TABS: 650.0000 mg | ORAL_TABLET | ORAL | Status: DC | PRN

## 2021-02-17 MED ORDER — ONDANSETRON HCL 4 MG/2ML IJ SOLN: 4.0000 mg | Freq: Four times a day (QID) | INTRAMUSCULAR | Status: DC | PRN

## 2021-02-17 MED ORDER — FLEET ENEMA 7-19 GM/118ML RE ENEM: 1.0000 | ENEMA | RECTAL | Status: DC | PRN

## 2021-02-17 MED ORDER — OXYCODONE-ACETAMINOPHEN 5-325 MG PO TABS: 1.0000 | ORAL_TABLET | ORAL | Status: DC | PRN

## 2021-02-17 MED ORDER — OXYCODONE-ACETAMINOPHEN 5-325 MG PO TABS: 2.0000 | ORAL_TABLET | ORAL | Status: DC | PRN

## 2021-02-17 MED ORDER — LACTATED RINGERS IV SOLN: 500.0000 mL | INTRAVENOUS | Status: DC | PRN

## 2021-02-17 MED ORDER — SOD CITRATE-CITRIC ACID 500-334 MG/5ML PO SOLN: 30.0000 mL | ORAL | Status: DC | PRN

## 2021-02-17 MED ORDER — LIDOCAINE HCL (PF) 1 % IJ SOLN: 30.0000 mL | INTRAMUSCULAR | Status: DC | PRN

## 2021-02-17 MED ORDER — OXYTOCIN-SODIUM CHLORIDE 30-0.9 UT/500ML-% IV SOLN: 2.5000 [IU]/h | INTRAVENOUS | Status: DC

## 2021-02-17 MED ORDER — OXYTOCIN BOLUS FROM INFUSION: 333.0000 mL | Freq: Once | INTRAVENOUS | Status: DC

## 2021-02-17 NOTE — Progress Notes (Signed)
VSS>  FHR Cat 1.  Ctx q 2-3 minutes per pt, getting stronger.  Breathing through ctx  Cx 7/90/0.  Wants to get tub ready then AROM.  Agree w/plan.

## 2021-02-17 NOTE — Progress Notes (Signed)
Labor Progress Note Denise Wu is a 24 y.o. G1P0000 at [redacted]w[redacted]d presented for early labor but had elevated pressures in MAU so was admitted for expectant management/possible IOL if needed.  S:  Pt sitting in chair, states contractions are getting more frequent. Definitely getting more uncomfortable, wants to walk and change positions for awhile before being checked. FOB and MGM at bedside for support.   O:  BP 130/85   Pulse 72   Temp 98.2 F (36.8 C) (Oral)   Resp 18   Ht 5' (1.524 m)   Wt 153 lb (69.4 kg)   LMP 05/14/2020   SpO2 100%   BMI 29.88 kg/m  EFM: baseline 135 bpm/ moderate variability/ 15x15 accels/ no decels  Toco/IUPC: q22min SVE: Dilation: 2.5 Effacement (%): 50 Station: -2 Presentation: Vertex (confirmed by U/S) Exam by:: weston,rn  A/P: 25 y.o. G1P0000 [redacted]w[redacted]d  1. Labor: early labor, expectant management 2. FWB: cat 1 3. Pain: well controlled with deep breathing and familial support 4. Elevated BP w/o diagnosis: labs normal, BP remains stable. 5. Desires waterbirth, ok as long as BP remains normal.  Anticipate SVD.  Bernerd Limbo, CNM 10:29 AM

## 2021-02-17 NOTE — Progress Notes (Signed)
Patient Vitals for the past 4 hrs:  BP Temp Temp src Pulse  02/17/21 2114 127/80 98.3 F (36.8 C) Oral 95   Tub is very helpful. Wants AROM now.  AROM w/clear fluid.  Cx 7.5/100/0.  FHR 140 w/ + accels. Ctx q 2-3 minutes.  Continue expectant mgt.

## 2021-02-17 NOTE — Progress Notes (Signed)
Labor Progress Note Denise Wu is a 25 y.o. G1P0000 at [redacted]w[redacted]d presented for IOL for elevated blood pressure without diagnosis of preeclampsia.  S:  Pt sitting on ball, reports contractions have gotten much stronger but still not very frequent. Requests cervical exam. FOB and MGM at bedside for support.  O:  BP 125/80   Pulse 72   Temp 98.2 F (36.8 C) (Oral)   Resp 16   Ht 5' (1.524 m)   Wt 153 lb (69.4 kg)   LMP 05/14/2020   SpO2 100%   BMI 29.88 kg/m  EFM: baseline 125 bpm/ moderate variability/ 15x15 accels/ no decels  Toco/IUPC: q7-55min SVE: Dilation: 4 Effacement (%): 80 Station: 0 Presentation: Vertex Exam by:: Roma Schanz CNM  A/P: 25 y.o. G1P0000 [redacted]w[redacted]d  1. Labor: Early labor, progressing normally without augmentation 2. FWB: Cat 1 3. Pain: Well controlled with breathing techniques, position changes and familial support 4. Offered possible augmentation of cytotec, but given progress will hold off and reassess in a few hours. Pt to continue walking and changing positions to facilitate labor.  Expectant management. Anticipate SVD.  Edd Arbour, CNM, MSN, IBCLC Certified Nurse Midwife, [facility]

## 2021-02-17 NOTE — Progress Notes (Signed)
Labor Progress Note Denise Wu is a 25 y.o. G1P0000 at [redacted]w[redacted]d presented for IOL for elevated blood pressure without diagnosis of preeclampsia.  S:  Pt on knees leaning forward on the ball, reports contractions have gotten much stronger and closer together. Requests cervical exam. Feels this is progressing slowly but verbalized understanding and felt reassured after cervical check. FOB and MGM at bedside for support.  O:  BP 122/84   Pulse 97   Temp 98 F (36.7 C) (Oral)   Resp 16   Ht 5' (1.524 m)   Wt 153 lb (69.4 kg)   LMP 05/14/2020   SpO2 100%   BMI 29.88 kg/m  EFM: baseline 125 bpm/ moderate variability/ 15x15 accels/ no decels  Toco/IUPC: q4-58min SVE: Dilation: 6 Effacement (%): 100 Station: -1 Presentation: Vertex Exam by:: Edd Arbour CNM  A/P: 25 y.o. G1P0000 [redacted]w[redacted]d  1. Labor: Active, progressing normally without augmentation 2. FWB: Cat 1 3. Pain: Well controlled with breathing techniques, position changes and familial support 4. Discussed augmentation of AROM or pitocin but gave reassurance that she is now in active labor and this is progressing normally for a first labor. Encouraged her to keep changing positions and helped her reposition to right lateral with the peanut ball.   Expectant management. Anticipate SVD.  Edd Arbour, CNM, MSN, IBCLC Certified Nurse Midwife, [facility]

## 2021-02-17 NOTE — MAU Note (Signed)
Pt reports contractions, some bleeding.  

## 2021-02-17 NOTE — H&P (Signed)
OBSTETRIC ADMISSION HISTORY AND PHYSICAL  Denise Wu is a 25 y.o. female G1P0000 with IUP at [redacted]w[redacted]d by LMP c/w 10 wk scan presenting for early labor. On arrival had elevated BP, decision made to admit. She reports +FMs, No LOF, no VB, no blurry vision, headaches or peripheral edema, and RUQ pain.  She plans on breast feeding. She declines birth control. She received her prenatal care at [facility]   Dating: By LMP --->  Estimated Date of Delivery: 02/18/21  Sono:    @[redacted]w[redacted]d , CWD, normal anatomy, breech presentation, 367g, 33% EFW   Prenatal History/Complications:  -History of Covid in second trimester  Past Medical History: Past Medical History:  Diagnosis Date  . Medical history non-contributory     Past Surgical History: Past Surgical History:  Procedure Laterality Date  . WISDOM TOOTH EXTRACTION      Obstetrical History: OB History    Gravida  1   Para  0   Term  0   Preterm  0   AB  0   Living  0     SAB  0   IAB  0   Ectopic  0   Multiple  0   Live Births  0           Social History Social History   Socioeconomic History  . Marital status: Married    Spouse name: Not on file  . Number of children: Not on file  . Years of education: Not on file  . Highest education level: Not on file  Occupational History  . Not on file  Tobacco Use  . Smoking status: Light Tobacco Smoker  . Smokeless tobacco: Never Used  . Tobacco comment: socailly  Vaping Use  . Vaping Use: Not on file  Substance and Sexual Activity  . Alcohol use: Yes    Comment: social   . Drug use: Not Currently  . Sexual activity: Yes    Birth control/protection: None    Comment: natural planning   Other Topics Concern  . Not on file  Social History Narrative  . Not on file   Social Determinants of Health   Financial Resource Strain: Not on file  Food Insecurity: Not on file  Transportation Needs: Not on file  Physical Activity: Not on file  Stress: Not on file   Social Connections: Not on file    Family History: Family History  Problem Relation Age of Onset  . Hypothyroidism Mother   . Diabetes Paternal Grandfather     Allergies: No Known Allergies  Medications Prior to Admission  Medication Sig Dispense Refill Last Dose  . Prenatal Vit-Fe Fumarate-FA (MULTIVITAMIN-PRENATAL) 27-0.8 MG TABS tablet Take 1 tablet by mouth daily at 12 noon.   Past Month at Unknown time  . cyclobenzaprine (FLEXERIL) 10 MG tablet Take 1 tablet (10 mg total) by mouth 3 (three) times daily as needed for muscle spasms. (Patient not taking: Reported on 02/16/2021) 30 tablet 2      Review of Systems   All systems reviewed and negative except as stated in HPI  Blood pressure 132/89, pulse 68, temperature 98.7 F (37.1 C), temperature source Oral, resp. rate 18, height 5' (1.524 m), weight 69.4 kg, last menstrual period 05/14/2020, SpO2 100 %. General appearance: alert, cooperative and appears stated age Lungs: clear to auscultation bilaterally Heart: regular rate and rhythm Abdomen: soft, non-tender; bowel sounds normal Pelvic: deferred Extremities: Homans sign is negative, no sign of DVT Presentation: cephalic on BSUS Fetal  monitoring baseline 130, mod variability, pos accels,  Uterine activity q4-5 min  Dilation: 2.5 Effacement (%): 50 Station: -2 Exam by:: weston,rn   Prenatal labs: ABO, Rh: --/--/O POS (04/28 0443) Antibody: NEG (04/28 0443) Rubella: 6.55 (10/05 0927) RPR: Non Reactive (02/22 0908)  HBsAg: Negative (10/05 0927)  HIV: Non Reactive (02/22 0908)  GBS: Negative/-- (04/04 1600)  2 hr Glucola normal Genetic screening  declined Anatomy US normal   Prenatal Transfer Tool  Maternal Diabetes: No Genetic Screening: Declined Maternal Ultrasounds/Referrals: Normal Fetal Ultrasounds or other Referrals:  None Maternal Substance Abuse:  No Significant Maternal Medications:  None Significant Maternal Lab Results: None  Results for  orders placed or performed during the hospital encounter of 02/17/21 (from the past 24 hour(s))  Type and screen [facility]   Collection Time: 02/17/21  4:43 AM  Result Value Ref Range   ABO/RH(D) O POS    Antibody Screen NEG    Sample Expiration      02/20/2021,2359 Performed at [facility], [address], [place], [place] [address]   CBC   Collection Time: 02/17/21  4:44 AM  Result Value Ref Range   WBC 10.1 4.0 - 10.5 K/uL   RBC 4.20 3.87 - 5.11 MIL/uL   Hemoglobin 12.1 12.0 - 15.0 g/dL   HCT 97.5 88.3 - 25.4 %   MCV 87.9 80.0 - 100.0 fL   MCH 28.8 26.0 - 34.0 pg   MCHC 32.8 30.0 - 36.0 g/dL   RDW 98.2 64.1 - 58.3 %   Platelets 231 150 - 400 K/uL   nRBC 0.0 0.0 - 0.2 %    Patient Active Problem List   Diagnosis Date Noted  . Normal labor 02/17/2021  . Encounter for supervision of normal first pregnancy in third trimester 07/27/2020  . PPD positive 12/03/2019  . History of COVID-19 12/03/2019    Assessment/Plan:  Denise Wu is a 25 y.o. G1P0000 at [redacted]w[redacted]d here for early labor/elevated blood pressures.   #Labor: continue expectant mgmt. Patient desires waterbirth and has done classes/signed consent. Patient prefers low intervention - discussed nipple stimulation, walking/labor position, etc. Discussed in detail that pending further elevated blood pressures and/or abnormal labs would not be able to have a water birth and patient verbalized understanding.   #Elevated BP w/o diagnosis: admission BP 150/92, subsequent rechecks have been normotensive. PEC labs pending. Will continue to monitor.   #Pain: Per patient request #FWB: Cat I  #ID:  [id number]  #MOF: breast #MOC:declines   Gita Kudo, MD  02/17/2021, 5:58 AM

## 2021-02-18 ENCOUNTER — Encounter (HOSPITAL_COMMUNITY): Payer: Self-pay | Admitting: Obstetrics & Gynecology

## 2021-02-18 DIAGNOSIS — Z3A39 39 weeks gestation of pregnancy: Secondary | ICD-10-CM

## 2021-02-18 MED ORDER — FERROUS SULFATE 325 (65 FE) MG PO TABS
325.0000 mg | ORAL_TABLET | ORAL | Status: DC
  Administered 2021-02-18 – 2021-02-20 (×2): 325 mg via ORAL
  Filled 2021-02-18 (×2): qty 1

## 2021-02-18 MED ORDER — ACETAMINOPHEN 325 MG PO TABS: 650.0000 mg | ORAL_TABLET | ORAL | Status: DC | PRN

## 2021-02-18 MED ORDER — BISACODYL 10 MG RE SUPP: 10.0000 mg | Freq: Every day | RECTAL | Status: DC | PRN

## 2021-02-18 MED ORDER — DIPHENHYDRAMINE HCL 25 MG PO CAPS: 25.0000 mg | ORAL_CAPSULE | Freq: Four times a day (QID) | ORAL | Status: DC | PRN

## 2021-02-18 MED ORDER — DOCUSATE SODIUM 100 MG PO CAPS
100.0000 mg | ORAL_CAPSULE | Freq: Two times a day (BID) | ORAL | Status: DC
  Administered 2021-02-18 – 2021-02-20 (×4): 100 mg via ORAL
  Filled 2021-02-18 (×4): qty 1

## 2021-02-18 MED ORDER — SIMETHICONE 80 MG PO CHEW: 80.0000 mg | CHEWABLE_TABLET | ORAL | Status: DC | PRN

## 2021-02-18 MED ORDER — FLEET ENEMA 7-19 GM/118ML RE ENEM: 1.0000 | ENEMA | Freq: Every day | RECTAL | Status: DC | PRN

## 2021-02-18 MED ORDER — COCONUT OIL OIL
1.0000 "application " | TOPICAL_OIL | Status: DC | PRN
  Administered 2021-02-20: 1 via TOPICAL

## 2021-02-18 MED ORDER — BENZOCAINE-MENTHOL 20-0.5 % EX AERO: 1.0000 "application " | INHALATION_SPRAY | CUTANEOUS | Status: DC | PRN

## 2021-02-18 MED ORDER — ONDANSETRON HCL 4 MG/2ML IJ SOLN: 4.0000 mg | INTRAMUSCULAR | Status: DC | PRN

## 2021-02-18 MED ORDER — IBUPROFEN 600 MG PO TABS
600.0000 mg | ORAL_TABLET | Freq: Four times a day (QID) | ORAL | Status: DC
  Filled 2021-02-18: qty 1

## 2021-02-18 MED ORDER — TETANUS-DIPHTH-ACELL PERTUSSIS 5-2.5-18.5 LF-MCG/0.5 IM SUSY: 0.5000 mL | PREFILLED_SYRINGE | Freq: Once | INTRAMUSCULAR | Status: DC

## 2021-02-18 MED ORDER — METHYLERGONOVINE MALEATE 0.2 MG/ML IJ SOLN: 0.2000 mg | INTRAMUSCULAR | Status: DC | PRN

## 2021-02-18 MED ORDER — MEDROXYPROGESTERONE ACETATE 150 MG/ML IM SUSP: 150.0000 mg | INTRAMUSCULAR | Status: DC | PRN

## 2021-02-18 MED ORDER — ONDANSETRON HCL 4 MG PO TABS: 4.0000 mg | ORAL_TABLET | ORAL | Status: DC | PRN

## 2021-02-18 MED ORDER — METHYLERGONOVINE MALEATE 0.2 MG PO TABS: 0.2000 mg | ORAL_TABLET | ORAL | Status: DC | PRN

## 2021-02-18 MED ORDER — WITCH HAZEL-GLYCERIN EX PADS: 1.0000 "application " | MEDICATED_PAD | CUTANEOUS | Status: DC | PRN

## 2021-02-18 MED ORDER — MEASLES, MUMPS & RUBELLA VAC IJ SOLR: 0.5000 mL | Freq: Once | INTRAMUSCULAR | Status: DC

## 2021-02-18 MED ORDER — DIBUCAINE (PERIANAL) 1 % EX OINT: 1.0000 "application " | TOPICAL_OINTMENT | CUTANEOUS | Status: DC | PRN

## 2021-02-18 MED ORDER — PRENATAL MULTIVITAMIN CH
1.0000 | ORAL_TABLET | Freq: Every day | ORAL | Status: DC
  Administered 2021-02-18 – 2021-02-20 (×3): 1 via ORAL
  Filled 2021-02-18 (×3): qty 1

## 2021-02-18 NOTE — Lactation Note (Signed)
This note was copied from a baby's chart. Lactation Consultation Note  Patient Name: Denise Wu ACZYS'A Date: 02/18/2021 Reason for consult: Initial assessment Age:27 hours    Mother is a P22,  Mother was given [facility] brochure and basic teaching done.  Mother reports that infant is feeding well.  Reviewed hand expression with mother. Observed large drops of colostrum.  Mother was observed with infant latched on at the Right  breast. Observed infant suckling with audible swallows. Infant sustained latch for 20 mins in football hold.   Mother to continue to cue base feed infant and feed at least 8-12 times or more in 24 hours and advised to allow for cluster feeding infant as needed.  Mother to continue to due STS. Mother is aware of available LC services at [facility], BFSG'S, OP Dept, and phone # for questions or concerns about breastfeeding.  Mother receptive to all teaching and plan of care.    Maternal Data    Feeding Mother's Current Feeding Choice: Breast Milk  LATCH Score Latch: Grasps breast easily, tongue down, lips flanged, rhythmical sucking.  Audible Swallowing: Spontaneous and intermittent  Type of Nipple: Everted at rest and after stimulation  Comfort (Breast/Nipple): Soft / non-tender  Hold (Positioning): Assistance needed to correctly position infant at breast and maintain latch.  LATCH Score: 9   Lactation Tools Discussed/Used    Interventions Interventions: Breast compression;Skin to skin;Assisted with latch;Adjust position;Support pillows;Expressed milk;Breast feeding basics reviewed;Position options;Education  Discharge WIC Program: No  Consult Status Consult Status: Follow-up Date: 02/19/21 Follow-up type: In-patient    Stevan Born [facility] 02/18/2021, 1:12 PM

## 2021-02-18 NOTE — Social Work (Signed)
CSW verbally consulted and informed MOB has questions regarding Medicaid. CSW made referral to Financial Navigator J.Malloy, who will follow-up.  Manfred Arch, MSW, [employer] Clinical Social Work [employer] and [employer] [phone]

## 2021-02-18 NOTE — Lactation Note (Signed)
This note was copied from a baby's chart. Lactation Consultation Note  Patient Name: Denise Wu PVXYI'A Date: 02/18/2021   Age:27 hours Per , L&D RN infant latched well in L&D and mom would like to see LC on MBU.  Maternal Data    Feeding    LATCH Score Latch: Repeated attempts needed to sustain latch, nipple held in mouth throughout feeding, stimulation needed to elicit sucking reflex.  Audible Swallowing: A few with stimulation  Type of Nipple: Everted at rest and after stimulation  Comfort (Breast/Nipple): Soft / non-tender  Hold (Positioning): Assistance needed to correctly position infant at breast and maintain latch.  LATCH Score: 7   Lactation Tools Discussed/Used    Interventions    Discharge    Consult Status      [name] 02/18/2021, 2:42 AM

## 2021-02-18 NOTE — Discharge Summary (Signed)
Postpartum Discharge Summary  Date of Service updated 02/20/21     Patient Name: Denise Wu DOB: 01-Jan-1996 MRN: [id number]  Date of admission: 02/17/2021 Delivery date:02/18/2021  Delivering provider: Christin Fudge  Date of discharge: 02/20/2021  Admitting diagnosis: Normal labor [O80, Z37.9] Intrauterine pregnancy: [redacted]w[redacted]d    Secondary diagnosis:  Principal Problem:   Vaginal delivery Active Problems:   PPD positive   History of COVID-19  Additional problems: as noted above Discharge diagnosis: Term Pregnancy Delivered                                              Post partum procedures:none Augmentation: AROM Complications: None  Hospital course: Onset of Labor With Vaginal Delivery      25y.o. yo G1P0000 at 436w0das admitted in Active Labor on 02/17/2021. Patient had an uncomplicated labor course as follows:  Membrane Rupture Time/Date: 11:45 PM ,02/17/2021   Delivery Method:Vaginal, Spontaneous  Episiotomy: None  Lacerations:  None  Patient had an uncomplicated postpartum course.  She is ambulating, tolerating a regular diet, passing flatus, and urinating well. Patient is discharged home in stable condition on 02/20/21.  Newborn Data: Birth date:02/18/2021  Birth time:12:55 AM  Gender:Female  Living status:Living  Apgars:8 ,9  Weight:3121 g   Magnesium Sulfate received: No BMZ received: No Rhophylac:N/A MMR:N/A T-DaP:declined Flu: No Transfusion:No  Physical exam  Vitals:   02/18/21 2224 02/19/21 0557 02/19/21 2245 02/20/21 0550  BP: 118/75 116/80 119/83 116/72  Pulse: 82 68 96 68  Resp: 18 18 18 18   Temp: 98.3 F (36.8 C) 98 F (36.7 C) 98.4 F (36.9 C) 98.3 F (36.8 C)  TempSrc: Oral Oral Oral Oral  SpO2: 99%     Weight:      Height:       General: alert, cooperative and no distress Lochia: appropriate Uterine Fundus: firm Incision: N/A DVT Evaluation: No evidence of DVT seen on physical exam. No cords or calf tenderness. No  significant calf/ankle edema. Labs: Lab Results  Component Value Date   WBC 10.1 02/17/2021   HGB 12.1 02/17/2021   HCT 36.9 02/17/2021   MCV 87.9 02/17/2021   PLT 231 02/17/2021   CMP Latest Ref Rng & Units 02/17/2021  Glucose 70 - 99 mg/dL 87  BUN 6 - 20 mg/dL 10  Creatinine 0.44 - 1.00 mg/dL 0.60  Sodium 135 - 145 mmol/L 135  Potassium 3.5 - 5.1 mmol/L 3.9  Chloride 98 - 111 mmol/L 104  CO2 22 - 32 mmol/L 22  Calcium 8.9 - 10.3 mg/dL 9.0  Total Protein 6.5 - 8.1 g/dL 6.8  Total Bilirubin 0.3 - 1.2 mg/dL 0.5  Alkaline Phos 38 - 126 U/L 311(H)  AST 15 - 41 U/L 19  ALT 0 - 44 U/L 12   Edinburgh Score: Edinburgh Postnatal Depression Scale Screening Tool 02/18/2021  I have been able to laugh and see the funny side of things. (No Data)     After visit meds:  Allergies as of 02/20/2021   No Known Allergies     Medication List    STOP taking these medications   cyclobenzaprine 10 MG tablet Commonly known as: FLEXERIL     TAKE these medications   acetaminophen 325 MG tablet Commonly known as: Tylenol Take 2 tablets (650 mg total) by mouth every 6 (six) hours as needed for  mild pain, moderate pain, fever or headache (for pain scale < 4).   coconut oil Oil Apply 1 application topically as needed (nipple pain).   ferrous sulfate 325 (65 FE) MG tablet Take 1 tablet (325 mg total) by mouth every other day.   ibuprofen 600 MG tablet Commonly known as: ADVIL Take 1 tablet (600 mg total) by mouth every 6 (six) hours.   multivitamin-prenatal 27-0.8 MG Tabs tablet Take 1 tablet by mouth daily at 12 noon.        Discharge home in stable condition Infant Feeding: Breast Infant Disposition:home with mother Discharge instruction: per After Visit Summary and Postpartum booklet. Activity: Advance as tolerated. Pelvic rest for 6 weeks.  Diet: routine diet Future Appointments: Future Appointments  Date Time Provider [place]  03/16/2021 10:50 AM Darlina Rumpf, CNM [facility]   Follow up Visit:  [place] [facility] [place] [facility] Follow up in 4 day(s).   Specialty: Obstetrics and Gynecology Contact information: [place] [phone]              Please schedule this patient for a In person postpartum visit in 4 weeks with the following provider: APP. Additional Postpartum F/U: none Low risk pregnancy complicated by: PPD positive, h/o COVID19 Delivery mode:  Vaginal, Spontaneous  Anticipated Birth Control:  none  02/20/2021 Fatima Blank, CNM

## 2021-02-19 NOTE — Lactation Note (Addendum)
This note was copied from a baby's chart. Lactation Consultation Note  Patient Name: Denise Wu [id number] Date: 02/19/2021 Reason for consult: Follow-up assessment;Primapara;1st time breastfeeding;Term Age:27 hours  LC student to room for follow up consultation. Mom reported that she had finished feeding shortly before entering room. Mom reported that she had a blister on the left nipple that she pulled off, LC student recommended using her colostrum to help with healing. Breast were slightly full, still tender to touch. Baby showed signs of huger; mom latched baby to the left breast in football hold. [facility] student made minor adjustments to positioning. Mom reports that when baby first comes onto the breast there is a bit of pain, but the pain subsides shortly after the initial latch. Baby had audible and strong sucks and swallows with flanged lips.   Mom reported that she would like to receive a manual pump for feedings as well. When [facility] student returned to room, baby was latched to the right breast in football hold. Mom reported no discomfort. [facility] student went over milk storage, cleaning and care, and how to use the pump. Mom is size 24 flange. Since there were some concerns about bilirubin levels, LC student went over how to spoon feed baby. Mom was made aware of donor milk in the lab if necessary. [facility] student encouraged mom to continue to feed baby 8-12 times a day, and informed mother of lactation services. [facility] student reviewed warning signs of infant not feeding well, engorgement, and caring for self; nutrition and resting. [facility] student went over risks associated with pacifier use. All questions and concerns were answered at this time.  Feeding plan: 1. STS 2. Feed baby on demand at least 8-12 times a 24 hour period 3. Pump 4-6 times per mom's goal in a 24 hour period 4. Feed pumped volume back to baby via spoonfeeding 5. Have a balanced diet, stay hydrated, and rest 6. Avoid pacifier  use  Maternal Data    Feeding Mother's Current Feeding Choice: Breast Milk  LATCH Score Latch: Grasps breast easily, tongue down, lips flanged, rhythmical sucking.  Audible Swallowing: Spontaneous and intermittent  Type of Nipple: Everted at rest and after stimulation  Comfort (Breast/Nipple): Filling, red/small blisters or bruises, mild/mod discomfort  Hold (Positioning): Assistance needed to correctly position infant at breast and maintain latch.  LATCH Score: 8   Lactation Tools Discussed/Used Tools: Pump Breast pump type: Manual Pump Education: Setup, frequency, and cleaning;Milk Storage Reason for Pumping: mother's choice  Interventions Interventions: Breast feeding basics reviewed;Assisted with latch;Breast massage;Hand pump;Adjust position;Support pillows;Education  Discharge Pump: Manual  Consult Status Consult Status: Follow-up Date: 02/20/21 Follow-up type: In-patient    Denise Wu 02/19/2021, 1:40 PM

## 2021-02-19 NOTE — Progress Notes (Signed)
Post Partum Day 1 Subjective: no complaints, up ad lib, voiding and tolerating PO  Objective: Blood pressure 116/80, pulse 68, temperature 98 F (36.7 C), temperature source Oral, resp. rate 18, height 5' (1.524 m), weight 69.4 kg, last menstrual period 05/14/2020, SpO2 99 %, unknown if currently breastfeeding.  Physical Exam:  General: alert, cooperative and no distress Lochia: appropriate Uterine Fundus: firm Incision: n/a DVT Evaluation: No evidence of DVT seen on physical exam.  Recent Labs    02/17/21 0444  HGB 12.1  HCT 36.9    Assessment/Plan:  Patient doing well postpartum and states she is finally sleeping some. Discussed discharge tomorrow and patient agreeable to plan of care.    LOS: 2 days   Rolm Bookbinder CNM 02/19/2021, 10:47 AM

## 2021-02-20 MED ORDER — IBUPROFEN 600 MG PO TABS: 600.0000 mg | ORAL_TABLET | Freq: Four times a day (QID) | ORAL | 0 refills | Status: AC

## 2021-02-20 MED ORDER — ACETAMINOPHEN 325 MG PO TABS: 650.0000 mg | ORAL_TABLET | Freq: Four times a day (QID) | ORAL | Status: AC | PRN

## 2021-02-20 MED ORDER — COCONUT OIL OIL: 1.0000 "application " | TOPICAL_OIL | 0 refills | Status: AC | PRN

## 2021-02-20 MED ORDER — FERROUS SULFATE 325 (65 FE) MG PO TABS: 325.0000 mg | ORAL_TABLET | ORAL | 2 refills | Status: AC

## 2021-02-20 NOTE — Lactation Note (Signed)
This note was copied from a baby's chart. Lactation Consultation Note  Patient Name: Girl Peyten Punches [id number] Date: 02/20/2021 Reason for consult: Follow-up assessment;1st time breastfeeding;Primapara;Term Age:27 hours   G1P1 mother whose infant is now 18 hours old.  This is a term baby at 40+0 weeks.    Baby was swaddled and in father's arms when I arrived.  Reviewed feeding plan for after discharge.  Mother has been feeding on cue and stated baby fed often last night.  Her nipples are sensitive, however, she is using EBM/coconut oil and comfort gels for relief.  This morning her breasts are full and firm; no engorgement.  Reviewed engorgement and how to prevent.  Mother has a manual pump and a DEBP for home use.  Mother had no questions related to breast feeding.  LATCH scored 8-9; voiding and stooling well.  Baby's stools are transitioning.  Parents have a follow up pediatric appointment tomorrow.  Discussed OP services as desired and how to call our office if questions/concerns arise.  Mother verbalized understanding.   Maternal Data    Feeding Mother's Current Feeding Choice: Breast Milk  LATCH Score                    Lactation Tools Discussed/Used    Interventions Interventions: Education  Discharge Discharge Education: Engorgement and breast care (OP Services discussed)  Consult Status Consult Status: Complete Date: 02/20/21 Follow-up type: Call as needed    Eleanore Junio R Satoshi Kalas 02/20/2021, 8:00 AM

## 2021-02-23 ENCOUNTER — Ambulatory Visit: Payer: BC Managed Care – PPO

## 2021-03-16 ENCOUNTER — Ambulatory Visit (INDEPENDENT_AMBULATORY_CARE_PROVIDER_SITE_OTHER): Payer: BC Managed Care – PPO | Admitting: Advanced Practice Midwife

## 2021-03-16 ENCOUNTER — Encounter: Payer: Self-pay | Admitting: Advanced Practice Midwife

## 2021-03-16 ENCOUNTER — Other Ambulatory Visit: Payer: Self-pay

## 2021-03-16 NOTE — Patient Instructions (Signed)
Perinatal Depression When a woman feels excessive sadness, anger, or anxiety during pregnancy or during the first 12 months after she gives birth, she has a condition called perinatal depression. This can interfere with work, school, relationships, and other everyday activities. If it is not managed properly, it can also interfere with the woman's ability to take care of the baby. Symptoms of perinatal depression may feel worse when living with a newborn. Sometimes, these symptoms are left untreated because they are thought to be normal mood swings during and right after pregnancy. However, if you have intense symptoms of depression that last for more than 2 weeks, it is important to talk with your health care provider. This may be perinatal depression. What are the causes? The exact cause of this condition is not known. Hormonal changes during and after pregnancy may play a role in causing perinatal depression. What increases the risk? You are more likely to develop this condition if:  You have a personal or family history of depression, anxiety, or mood disorders.  You experience a stressful life event during pregnancy, such as the death of a loved one.  You have additional life stress, such as being a single parent.  You do not have support from family members or loved ones, or you are in an abusive relationship.  You have thyroid problems. What are the signs or symptoms? Symptoms of this condition include:  Emotional symptoms, such as: ? Feeling sad or hopeless. ? Feelings of guilt. ? Feeling irritable or overwhelmed.  Physical symptoms, such as: ? Changes with appetite or sleep. ? Lack of energy or motivation. ? Persistent headaches or stomach problems.  Behavioral symptoms, such as: ? Difficulty concentrating or completing tasks. ? Loss of interest in hobbies or relationships. How is this diagnosed? This condition is diagnosed based on a physical exam and mental  evaluation.  In some cases, your health care provider may use a depression screening tool. This includes a list of questions that can help a health care provider diagnose depression.  You may be referred to a mental health expert who specializes in treating perinatal depression. How is this treated? This condition may be treated with:  Talk therapy with a mental health professional. This may be interpersonal psychotherapy, couples therapy, cognitive behavioral therapy, or mother-child bonding therapy.  Medicines. Your health care provider will discuss the safety of the medicines prescribed during pregnancy and breastfeeding.  Support groups.  Brain stimulation or light therapies.  Stress reduction therapies, such as mindfulness.   Follow these instructions at home: Lifestyle  Do not use any products that contain nicotine or tobacco. These products include cigarettes, chewing tobacco, and vaping devices, such as e-cigarettes. If you need help quitting, ask your health care provider.  Do not drink alcohol when you are pregnant. It is also safest not to drink alcohol if you are breastfeeding.  After your baby is born, if you drink alcohol: ? Limit how much you have to 0-1 drink a day. ? Be aware of how much alcohol is in your drink. In the [place] one drink equals one 12 oz bottle of beer (355 mL), one 5 oz glass of wine (148 mL), or one 1 oz glass of hard liquor (44 mL).  Consider joining a support group for new mothers. Ask your health care provider for recommendations.  Take good care of yourself. Make sure you: ? Get as much sleep as possible. Talk with your partner about sharing the responsibility of getting up with   your baby if possible and sharing child care responsibilities equally. Make sleep a priority. ? Eat a healthy diet. This includes plenty of fruits and vegetables, whole grains, and lean proteins. ? Exercise regularly, as told by your health care provider. Ask your health  care provider what exercises are safe for you. Talk with your partner about making sure you both have opportunities to exercise. General instructions  Take over-the-counter and prescription medicines only as told by your health care provider.  Talk with your partner or family members about your feelings during pregnancy. Share any concerns, needs, or anxieties that you may have. Do not be afraid to ask for help. Find a mental health professional, if needed.  Ask for help with tasks or chores when you need it. Ask friends and family members to provide meals, watch your children, or help with cleaning.  Keep all follow-up visits. This is important. Contact a health care provider if:  You or people close to you notice that you have symptoms of depression.  Your symptoms of depression get worse.  You take medicines and have side effects, such as nausea or sleep problems. Get help right away if:  You feel like hurting yourself, your baby, or someone else. If you feel like you may hurt yourself or others, or have thoughts about taking your own life, get help right away. You can go to your nearest emergency department or:  Call your local emergency services (911 in the [place]  Call a suicide crisis helpline, such as the National Suicide Prevention Lifeline, at 1-800-273-8255. This is open 24 hours a day in the [place]  Text the Crisis Text Line at 741741 (in the [place] Summary  Perinatal depression is when a woman feels excessive sadness, anger, or anxiety during pregnancy or during the first 12 months after she gives birth.  If perinatal depression is not managed properly, it can interfere with the woman's ability to take care of the baby.  This condition is treated with medicines, talk therapy, stress reduction therapies, or a combination of treatments.  Talk with your partner or family members about your feelings. Ask for help when you need it. This information is not intended to replace  advice given to you by your health care provider. Make sure you discuss any questions you have with your health care provider. Document Revised: 04/02/2020 Document Reviewed: 04/02/2020 Elsevier Patient Education  2021 Elsevier Inc.  

## 2021-03-16 NOTE — Progress Notes (Signed)
Post Partum Visit Note  Denise Wu is a 25 y.o. G62P1001 female who presents for a postpartum visit. She is 3 weeks postpartum following a normal spontaneous vaginal delivery.  I have fully reviewed the prenatal and intrapartum course. The delivery was at [redacted]w[redacted]d gestational weeks.  Anesthesia: none. Postpartum course has been complicated by presumed mastitis. Patient states that when her baby was a little less than two weeks old the patient developed bleeding nipples, engorgement and a fever of 102.  She managed her symptoms at home by breastfeeding then pumping until her breasts were empty. She endorses complete resolution of symptoms. Baby is doing well. Baby is feeding by breast. Bleeding staining only. Bowel function is normal. Bladder function is normal. Patient is not sexually active. Contraception method is none. Postpartum depression screening: negative.   The pregnancy intention screening data noted above was reviewed. Potential methods of contraception were discussed. The patient elected to proceed with No Method.    Health Maintenance Due  Topic Date Due  . HPV VACCINES (1 - 2-dose series) Never done  . TETANUS/TDAP  Never done  . COVID-19 Vaccine (3 - Booster for Pfizer series) 07/16/2020    The following portions of the patient's history were reviewed and updated as appropriate: allergies, current medications, past family history, past medical history, past social history, past surgical history and problem list.  Review of Systems A comprehensive review of systems was negative.  Objective:  BP 109/72   Pulse 80   Wt 133 lb (60.3 kg)   BMI 25.97 kg/m    General:  alert, cooperative, appears stated age and no distress   Breasts:  normal  Lungs: normal work of breathing  Heart:  regular rate and rhythm, S1, S2 normal, no murmur, click, rub or gallop  Abdomen: soft, nontender   Pelvic  Deferred due to intact perineum and absence of complaints       Assessment:   1.  Normal postpartum exam  2. S/p normal pap 11/2019  3. Call office or present to MAU for evaluation if recurrence of fever, symptoms c/w mastitis before 6 weeks pp  Plan:   Essential components of care per ACOG recommendations:  1.  Mood and well being: Patient with negative depression screening today. Reviewed local resources for support.  - Patient tobacco use? No.   - hx of drug use? No.    2. Infant care and feeding:  -Patient currently breastmilk feeding? Yes. Discussed returning to work and pumping.  -Social determinants of health (SDOH) reviewed in EPIC. No concerns  3. Sexuality, contraception and birth spacing - Patient does not want a pregnancy in the next year.  - Reviewed forms of contraception in tiered fashion. Patient desired no method/today.     4. Sleep and fatigue -Encouraged family/partner/community support of 4 hrs of uninterrupted sleep to help with mood and fatigue  5. Physical Recovery  - Discussed patients delivery and complications. She describes her labor as good. - Patient had a Vaginal, no problems at delivery. Patient had an intact perineum laceration.  - Patient has urinary incontinence? No. - Patient is safe to resume physical and sexual activity. Advised water-based personal lubricant d/t breastfeeding  6.  Health Maintenance - HM due items addressed Yes  Diagnosis  Date Value Ref Range Status  12/03/2019   Final   - Negative for intraepithelial lesion or malignancy (NILM)   Pap smear not done at today's visit.   7. Chronic Disease/Pregnancy Condition follow up:  None  Clayton Bibles, MSN, CNM Certified Nurse Midwife, [employer] for [employer], [facility] 03/16/21 12:57 PM

## 2021-08-22 IMAGING — US US MFM OB COMP +14 WKS
1 series · 13 of 28 positions shown · non-contrast
Comparison: none

[Series 1: us mfm ob comp +14 wks · 119 acquisitions, 13 frames shown]
[im 5/119]
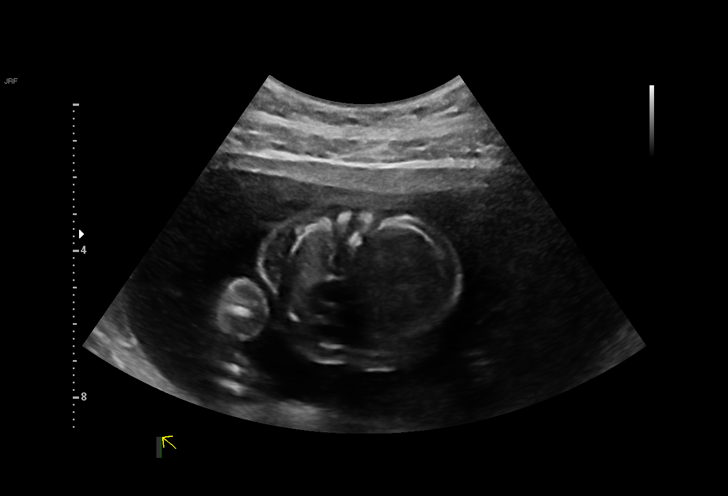
[im 14/119]
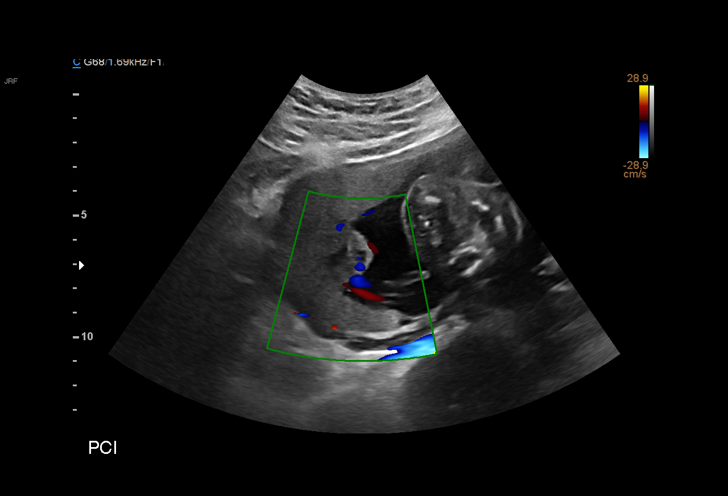
[im 22/119]
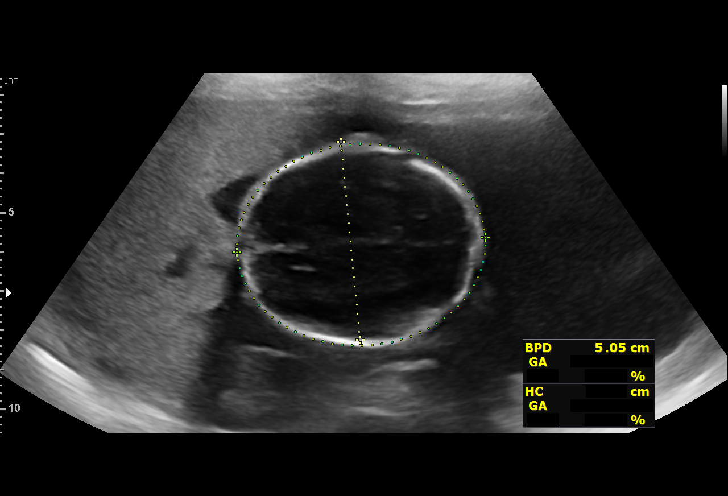
[im 31/119]
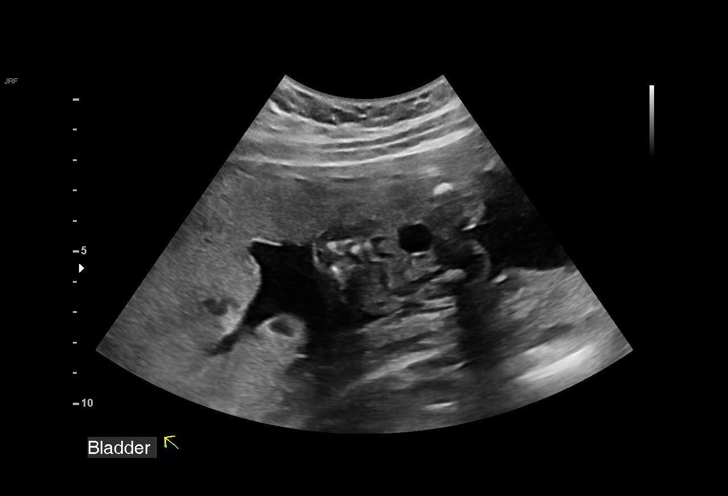
[im 40/119]
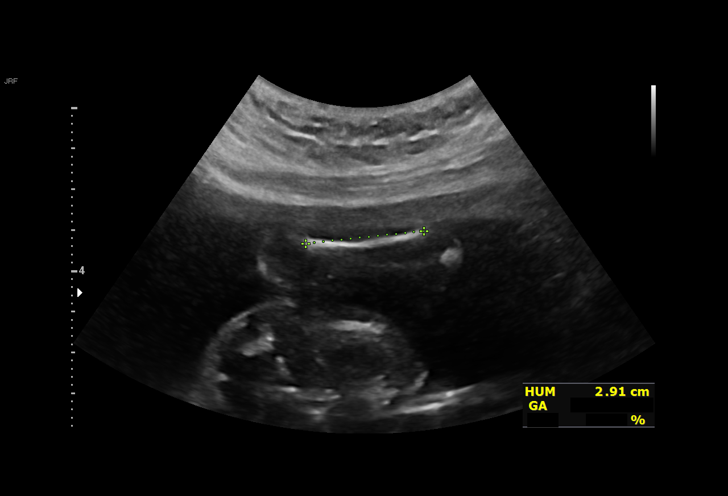
[im 49/119]
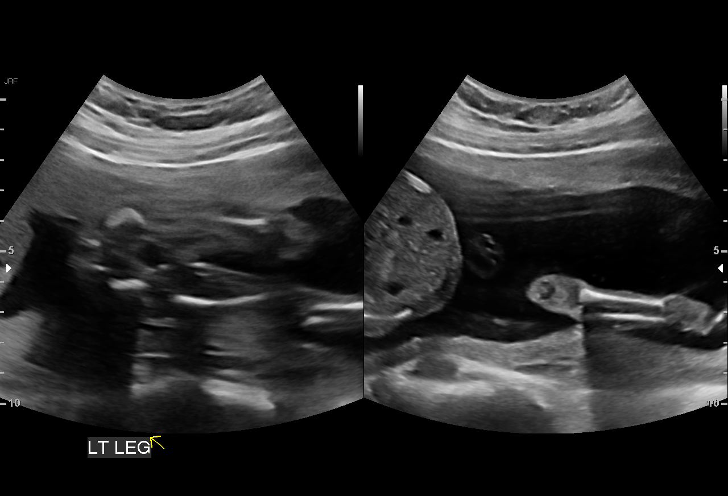
[im 62/119]
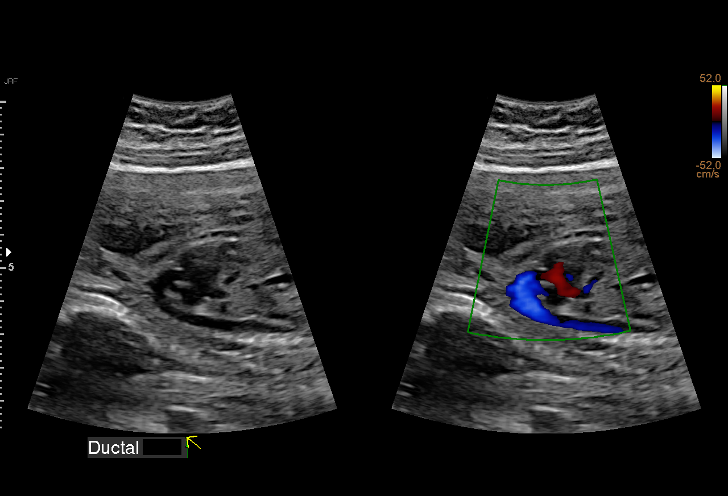
[im 70/119]
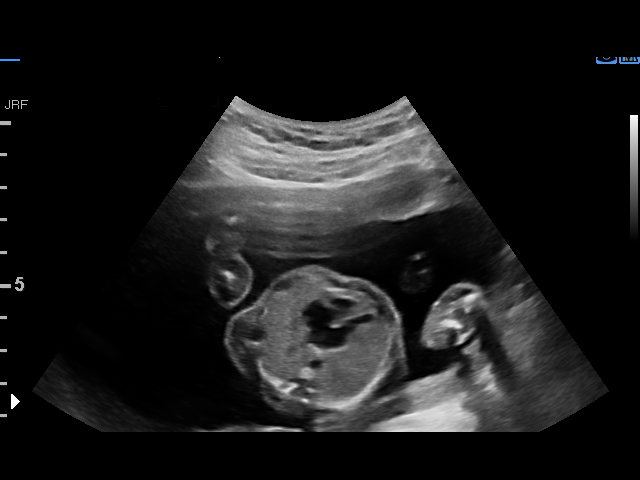
[im 79/119]
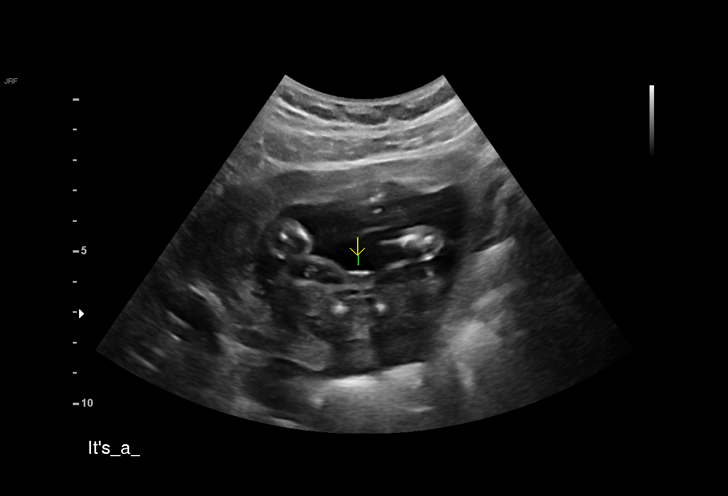
[im 88/119]
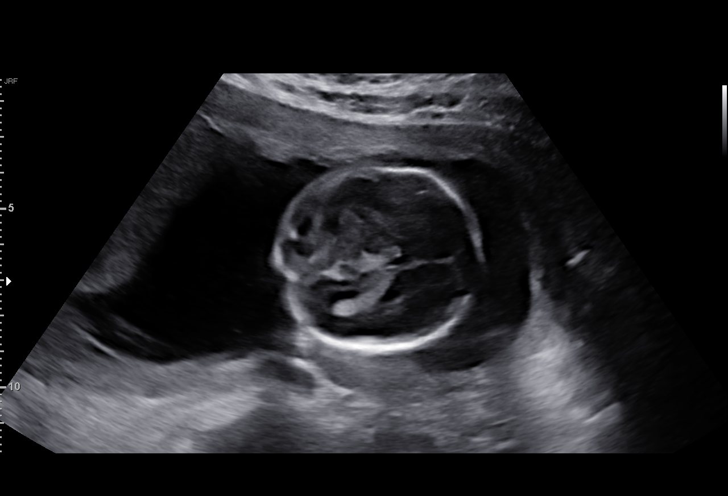
[im 97/119]
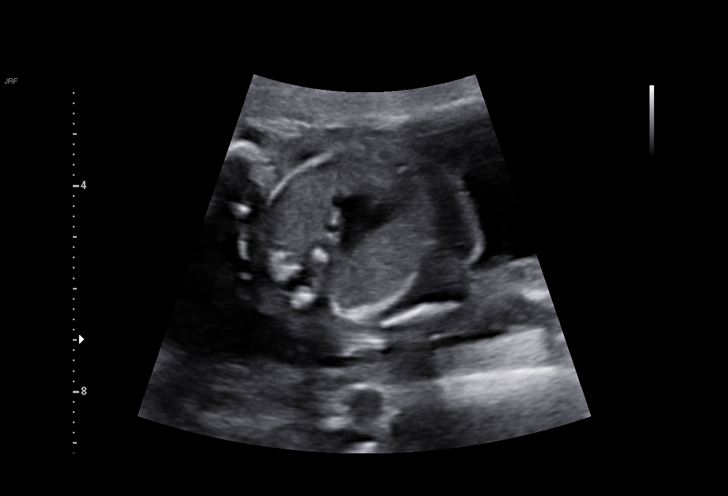
[im 105/119]
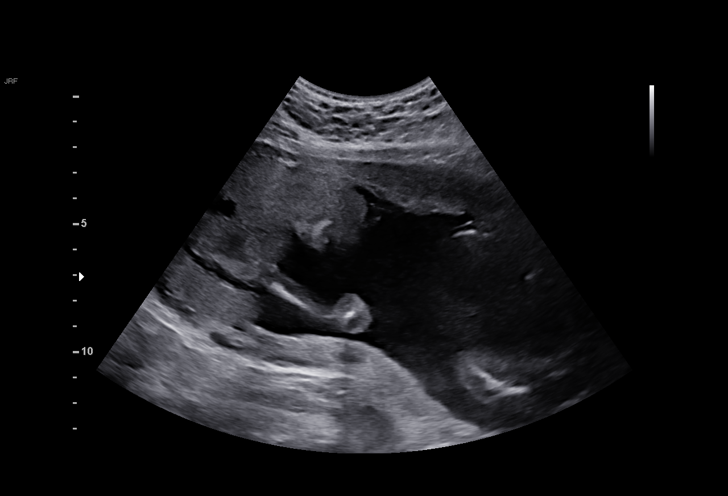
[im 114/119]
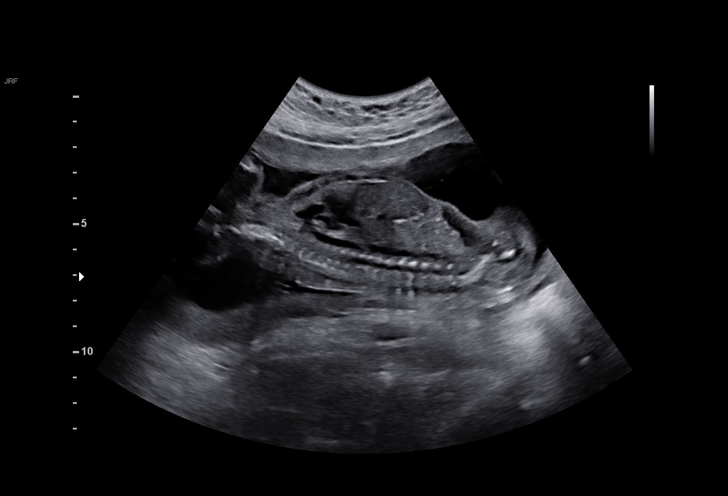

[13 of 28 positions shown; findings below may reference images not displayed]

1   US MFM OB COMP + 14 WK               76805.01     JIMBO CASCIO

Indications

 Fetal abnormality - other known or suspected
 20 weeks gestation of pregnancy
Fetal Evaluation

 Num Of Fetuses:          1
 Fetal Heart Rate(bpm):   145
 Cardiac Activity:        Observed
 Presentation:            Breech
 Placenta:                Right lateral
 P. Cord Insertion:       Visualized

                             Largest Pocket(cm)

Biometry

 BPD:      49.9   mm     G. Age:  21w 1d         59  %    CI:         75.36  %    70 - 86
                                                          FL/HC:       17.3  %    15.9 -
 HC:      182.3   mm     G. Age:  20w 4d         30  %    HC/AC:       1.12       1.06 -
 AC:      162.5   mm     G. Age:  21w 2d         59  %    FL/BPD:      63.1  %
 FL:       31.5   mm     G. Age:  19w 6d         11  %    FL/AC:       19.4  %    20 - 24
 HUM:      29.3   mm     G. Age:  19w 4d         13  %
 CER:        21   mm     G. Age:  20w 0d         34  %
 NFT:        3.6  mm

 LV:         6.3  mm
 CM:         2.6  mm
 Est. FW:     367   gm    0 lb 13 oz      33  %
OB History

 Gravidity:     1         Term:  0          Prem:  0        SAB:   0
 TOP:           0       Ectopic: 0         Living: 0
Gestational Age

 LMP:            20w 6d       Date:  05/14/20                   EDD:  02/18/21
 U/S Today:      20w 5d                                         EDD:  02/19/21
 Best:           20w 6d    Det. By:  LMP  (05/14/20)            EDD:  02/18/21
Anatomy

 Cranium:                Appears normal         Aortic Arch:            Appears normal
 Cavum:                  Appears normal         Ductal Arch:            Appears normal
 Ventricles:             Appears normal         Diaphragm:              Appears normal
 Choroid Plexus:         Appears normal         Stomach:                Appears normal, left
                                                                        sided
 Cerebellum:             Appears normal         Abdomen:                Appears normal
 Posterior Fossa:        Appears normal         Abdominal Wall:         Appears nml (cord
                                                                        insert, abd wall)
 Nuchal Fold:            Appears normal         Cord Vessels:           Appears normal (3
                                                                        vessel cord)
 Face:                   Appears normal         Kidneys:                Appear normal
                         (orbits and profile)
 Lips:                   Appears normal         Bladder:                Appears normal
 Thoracic:               Appears normal         Spine:                  Appears normal
 Heart:                  Appears normal         Upper Extremities:      Appears normal
                         (4CH, axis, and situs)
 RVOT:                   Appears normal         Lower Extremities:      Appears normal
 LVOT:                   Appears normal

 Other:   Heels and 5th digit visualized. Open hands visualized.
Cervix Uterus Adnexa

 Cervix
 Length:            4.17  cm.
Comments

 This patient was seen for a detailed fetal anatomy scan.
 She denies any significant past medical history and denies any
 problems in her current pregnancy.
 The patient has declined all screening tests for fetal aneuploidy
 in her current pregnancy.
 She was informed that the fetal growth and amniotic fluid level
 were appropriate for her gestational age.
 There were no obvious fetal anomalies noted on today's
 ultrasound exam.
 The patient was informed that anomalies may be missed due to
 technical limitations. If the fetus is in a suboptimal position or
 maternal habitus is increased, visualization of the fetus in the
 maternal uterus may be impaired.
 Follow up as indicated.

## 2021-12-14 ENCOUNTER — Inpatient Hospital Stay (HOSPITAL_COMMUNITY)
Admission: AD | Admit: 2021-12-14 | Discharge: 2021-12-14 | Disposition: A | Discharge status: Home / Self Care | Payer: Self-pay | Attending: Obstetrics & Gynecology | Admitting: Obstetrics & Gynecology

## 2021-12-14 ENCOUNTER — Encounter (HOSPITAL_COMMUNITY): Payer: Self-pay | Admitting: *Deleted

## 2021-12-14 ENCOUNTER — Inpatient Hospital Stay (HOSPITAL_COMMUNITY): Payer: Self-pay

## 2021-12-14 DIAGNOSIS — O3680X0 Pregnancy with inconclusive fetal viability, not applicable or unspecified: Secondary | ICD-10-CM | POA: Insufficient documentation

## 2021-12-14 DIAGNOSIS — R531 Weakness: Secondary | ICD-10-CM | POA: Insufficient documentation

## 2021-12-14 DIAGNOSIS — Z3A09 9 weeks gestation of pregnancy: Secondary | ICD-10-CM | POA: Insufficient documentation

## 2021-12-14 DIAGNOSIS — O26891 Other specified pregnancy related conditions, first trimester: Secondary | ICD-10-CM | POA: Insufficient documentation

## 2021-12-14 DIAGNOSIS — O200 Threatened abortion: Secondary | ICD-10-CM | POA: Insufficient documentation

## 2021-12-14 DIAGNOSIS — O209 Hemorrhage in early pregnancy, unspecified: Secondary | ICD-10-CM | POA: Insufficient documentation

## 2021-12-14 LAB — URINALYSIS, ROUTINE W REFLEX MICROSCOPIC
Bilirubin Urine: NEGATIVE
Glucose, UA: NEGATIVE mg/dL
Ketones, ur: NEGATIVE mg/dL
Leukocytes,Ua: NEGATIVE
Nitrite: NEGATIVE
Protein, ur: NEGATIVE mg/dL
RBC / HPF: >50 RBC/hpf — ABNORMAL HIGH (ref 0–5)
Specific Gravity, Urine: 1.018 (ref 1.005–1.030)
pH: 7 (ref 5.0–8.0)

## 2021-12-14 LAB — COMPREHENSIVE METABOLIC PANEL
ALT: 23 U/L (ref 0–44)
AST: 15 U/L (ref 15–41)
Albumin: 4.2 g/dL (ref 3.5–5.0)
Alkaline Phosphatase: 92 U/L (ref 38–126)
Anion gap: 8 (ref 5–15)
BUN: 9 mg/dL (ref 6–20)
CO2: 23 mmol/L (ref 22–32)
Calcium: 8.9 mg/dL (ref 8.9–10.3)
Chloride: 106 mmol/L (ref 98–111)
Creatinine, Ser: 0.65 mg/dL (ref 0.44–1.00)
GFR, Estimated: >60 mL/min (ref >60)
Glucose, Bld: 94 mg/dL (ref 70–99)
Potassium: 3.8 mmol/L (ref 3.5–5.1)
Sodium: 137 mmol/L (ref 135–145)
Total Bilirubin: 0.3 mg/dL (ref 0.3–1.2)
Total Protein: 7.5 g/dL (ref 6.5–8.1)

## 2021-12-14 LAB — CBC WITH DIFFERENTIAL/PLATELET
Abs Immature Granulocytes: 0.02 10*3/uL (ref 0.00–0.07)
Basophils Absolute: 0 10*3/uL (ref 0.0–0.1)
Basophils Relative: 1 %
Eosinophils Absolute: 0.1 10*3/uL (ref 0.0–0.5)
Eosinophils Relative: 2 %
HCT: 36.2 % (ref 36.0–46.0)
Hemoglobin: 12.2 g/dL (ref 12.0–15.0)
Immature Granulocytes: 0 %
Lymphocytes Relative: 38 %
Lymphs Abs: 2.4 10*3/uL (ref 0.7–4.0)
MCH: 29.6 pg (ref 26.0–34.0)
MCHC: 33.7 g/dL (ref 30.0–36.0)
MCV: 87.9 fL (ref 80.0–100.0)
Monocytes Absolute: 0.3 10*3/uL (ref 0.1–1.0)
Monocytes Relative: 5 %
Neutro Abs: 3.4 10*3/uL (ref 1.7–7.7)
Neutrophils Relative %: 54 %
Platelets: 286 10*3/uL (ref 150–400)
RBC: 4.12 MIL/uL (ref 3.87–5.11)
RDW: 11.9 % (ref 11.5–15.5)
WBC: 6.2 10*3/uL (ref 4.0–10.5)
nRBC: 0 % (ref 0.0–0.2)

## 2021-12-14 LAB — ABO/RH: ABO/RH(D): O POS

## 2021-12-14 LAB — HCG, QUANTITATIVE, PREGNANCY: hCG, Beta Chain, Quant, S: 171 m[IU]/mL — ABNORMAL HIGH (ref <5)

## 2021-12-14 LAB — POCT PREGNANCY, URINE: Preg Test, Ur: POSITIVE — AB

## 2021-12-14 MED ORDER — IBUPROFEN 800 MG PO TABS
800.0000 mg | ORAL_TABLET | Freq: Once | ORAL | Status: AC
  Administered 2021-12-14: 800 mg via ORAL
  Filled 2021-12-14: qty 1

## 2021-12-14 NOTE — MAU Note (Signed)
Had a miscarriage early on Sat morning, has continued to bleed.  This morning passed several clots. Bleeding is not as heavy now.  Has tried to get into her OB and other clinics, nothing available until March.  Is feeling a little disoriented, feels off. Has been cramping more than a period. Thinks she was a little over a month. Had not had pregnancy confirmed.

## 2021-12-14 NOTE — MAU Provider Note (Signed)
History     CSN: [id number]  Arrival date and time: 12/14/21 1625   None     Chief Complaint  Patient presents with   Abdominal Pain   Vaginal Bleeding   HPI  [name] Is a 26 y.o. female G2P1001 @ [redacted]w[redacted]d here in MAU with complaints of bleeding. She reports passing a fetus a fetus on Saturday morning. Reports seeing a fetus in the sack when it was passed. She reports continued vaginal bleeding since the SAB. She reports heavy bleeding today with passing clots. She does not have pain currently. She took ibuprofen Monday which helped. She is concerned today about the bleeding. She feels sluggish and weak.   OB History     Gravida  2   Para  1   Term  1   Preterm  0   AB  0   Living  1      SAB  0   IAB  0   Ectopic  0   Multiple  0   Live Births  1           Past Medical History:  Diagnosis Date   Medical history non-contributory     Past Surgical History:  Procedure Laterality Date   WISDOM TOOTH EXTRACTION      Family History  Problem Relation Age of Onset   Hypothyroidism Mother    Diabetes Paternal Grandfather     Social History   Tobacco Use   Smoking status: Light Smoker   Smokeless tobacco: Never   Tobacco comments:    socailly  Substance Use Topics   Alcohol use: Yes    Comment: social    Drug use: Not Currently    Allergies: No Known Allergies  Medications Prior to Admission  Medication Sig Dispense Refill Last Dose   acetaminophen (TYLENOL) 325 MG tablet Take 2 tablets (650 mg total) by mouth every 6 (six) hours as needed for mild pain, moderate pain, fever or headache (for pain scale < 4). (Patient not taking: Reported on 03/16/2021)      coconut oil OIL Apply 1 application topically as needed (nipple pain). (Patient not taking: Reported on 03/16/2021)  0    ferrous sulfate 325 (65 FE) MG tablet Take 1 tablet (325 mg total) by mouth every other day. 30 tablet 2    ibuprofen (ADVIL) 600 MG tablet Take 1 tablet (600 mg  total) by mouth every 6 (six) hours. (Patient not taking: Reported on 03/16/2021) 30 tablet 0    Prenatal Vit-Fe Fumarate-FA (MULTIVITAMIN-PRENATAL) 27-0.8 MG TABS tablet Take 1 tablet by mouth daily at 12 noon.      Results for orders placed or performed during the hospital encounter of 12/14/21 (from the past 48 hour(s))  Pregnancy, urine POC     Status: Abnormal   Collection Time: 12/14/21  5:18 PM  Result Value Ref Range   Preg Test, Ur POSITIVE (A) NEGATIVE    Comment:        THE SENSITIVITY OF THIS METHODOLOGY IS >24 mIU/mL   Urinalysis, Routine w reflex microscopic Urine, Clean Catch     Status: Abnormal   Collection Time: 12/14/21  5:51 PM  Result Value Ref Range   Color, Urine YELLOW YELLOW   APPearance CLEAR CLEAR   Specific Gravity, Urine 1.018 1.005 - 1.030   pH 7.0 5.0 - 8.0   Glucose, UA NEGATIVE NEGATIVE mg/dL   Hgb urine dipstick MODERATE (A) NEGATIVE   Bilirubin Urine NEGATIVE NEGATIVE  Ketones, ur NEGATIVE NEGATIVE mg/dL   Protein, ur NEGATIVE NEGATIVE mg/dL   Nitrite NEGATIVE NEGATIVE   Leukocytes,Ua NEGATIVE NEGATIVE   RBC / HPF >50 (H) 0 - 5 RBC/hpf   WBC, UA 0-5 0 - 5 WBC/hpf   Bacteria, UA RARE (A) NONE SEEN   Squamous Epithelial / LPF 0-5 0 - 5   Mucus PRESENT     Comment: Performed at [facility], [place] [address], [place], [place] [address]  CBC with Differential/Platelet     Status: None   Collection Time: 12/14/21  6:04 PM  Result Value Ref Range   WBC 6.2 4.0 - 10.5 K/uL   RBC 4.12 3.87 - 5.11 MIL/uL   Hemoglobin 12.2 12.0 - 15.0 g/dL   HCT 36.2 36.0 - 46.0 %   MCV 87.9 80.0 - 100.0 fL   MCH 29.6 26.0 - 34.0 pg   MCHC 33.7 30.0 - 36.0 g/dL   RDW 11.9 11.5 - 15.5 %   Platelets 286 150 - 400 K/uL   nRBC 0.0 0.0 - 0.2 %   Neutrophils Relative % 54 %   Neutro Abs 3.4 1.7 - 7.7 K/uL   Lymphocytes Relative 38 %   Lymphs Abs 2.4 0.7 - 4.0 K/uL   Monocytes Relative 5 %   Monocytes Absolute 0.3 0.1 - 1.0 K/uL   Eosinophils Relative 2 %    Eosinophils Absolute 0.1 0.0 - 0.5 K/uL   Basophils Relative 1 %   Basophils Absolute 0.0 0.0 - 0.1 K/uL   Immature Granulocytes 0 %   Abs Immature Granulocytes 0.02 0.00 - 0.07 K/uL    Comment: Performed at [facility], [address], [place], [place] [address]  Comprehensive metabolic panel     Status: None   Collection Time: 12/14/21  6:04 PM  Result Value Ref Range   Sodium 137 135 - 145 mmol/L   Potassium 3.8 3.5 - 5.1 mmol/L   Chloride 106 98 - 111 mmol/L   CO2 23 22 - 32 mmol/L   Glucose, Bld 94 70 - 99 mg/dL    Comment: Glucose reference range applies only to samples taken after fasting for at least 8 hours.   BUN 9 6 - 20 mg/dL   Creatinine, Ser 0.65 0.44 - 1.00 mg/dL   Calcium 8.9 8.9 - 10.3 mg/dL   Total Protein 7.5 6.5 - 8.1 g/dL   Albumin 4.2 3.5 - 5.0 g/dL   AST 15 15 - 41 U/L   ALT 23 0 - 44 U/L   Alkaline Phosphatase 92 38 - 126 U/L   Total Bilirubin 0.3 0.3 - 1.2 mg/dL   GFR, Estimated >60 >60 mL/min    Comment: (NOTE) Calculated using the CKD-EPI Creatinine Equation (2021)    Anion gap 8 5 - 15    Comment: Performed at [facility] [address], [place], [place] [address]  ABO/Rh     Status: None   Collection Time: 12/14/21  6:04 PM  Result Value Ref Range   ABO/RH(D) O POS    No rh immune globuloin      NOT A RH IMMUNE GLOBULIN CANDIDATE, PT RH POSITIVE Performed at [facility] [address], [place], [place] [address]   hCG, quantitative, pregnancy     Status: Abnormal   Collection Time: 12/14/21  6:04 PM  Result Value Ref Range   hCG, Beta Chain, Quant, S 171 (H) <5 mIU/mL    Comment:  GEST. AGE      CONC.  (mIU/mL)   <=1 WEEK        5 - 50     2 WEEKS       50 - 500     3 WEEKS       100 - 10,000     4 WEEKS     1,000 - 30,000     5 WEEKS     3,500 - 115,000   6-8 WEEKS     12,000 - 270,000    12 WEEKS     15,000 - 220,000        FEMALE AND NON-PREGNANT FEMALE:     LESS THAN 5 mIU/mL Performed at [facility], [place] [address], [place], [place] [address]     US OB LESS THAN 14 WEEKS WITH OB TRANSVAGINAL  Result Date: 12/14/2021 CLINICAL DATA:  Heavy bleeding, quantitative beta HCG 171 EXAM: OBSTETRIC <14 WK [place] AND TRANSVAGINAL OB US TECHNIQUE: Both transabdominal and transvaginal ultrasound examinations were performed for complete evaluation of the gestation as well as the maternal uterus, adnexal regions, and pelvic cul-de-sac. Transvaginal technique was performed to assess early pregnancy. COMPARISON:  None. FINDINGS: Intrauterine gestational sac: None Yolk sac:  Not Visualized. Embryo:  Not Visualized. Subchorionic hemorrhage:  None visualized. Maternal uterus/adnexae: Bilateral ovaries are unremarkable. The endometrium measures up to 13 mm with associated trace vascularity. A intramural fundal fibroid measuring 2.9 x 2 x 3 cm is noted. Other: Trace free fluid within the pelvis. IMPRESSION: 1. No intrauterine pregnancy. 2. No ectopic pregnancy. 3. Endometrium measures up to 13 mm with associated trace vascularity. Findings could represent retained products of conception. Electronically Signed   By: Iven Finn M.D.   On: 12/14/2021 19:11     Review of Systems  Constitutional:  Negative for fever.  Gastrointestinal:  Negative for abdominal pain.  Genitourinary:  Positive for vaginal discharge.  Physical Exam   Blood pressure 107/74, pulse 69, temperature 98.5 F (36.9 C), temperature source Oral, resp. rate 18, height 5' (1.524 m), weight 60.9 kg, last menstrual period 10/09/2021, SpO2 97 %, unknown if currently breastfeeding.  Physical Exam Constitutional:      General: She is not in acute distress.    Appearance: She is well-developed. She is not ill-appearing, toxic-appearing or diaphoretic.  Abdominal:     Tenderness: There is no abdominal tenderness. There is no guarding or rebound.  Genitourinary:    Uterus: Normal.   Skin:    General: Skin is warm.  Neurological:     Mental  Status: She is alert and oriented to person, place, and time.   MAU Course  Procedures None  MDM  O positive blood type Wet prep & GC HIV, CBC, Hcg, ABO US OB transvaginal   Reviewed [place] results with Dr. Harolyn Rutherford, minimal vascularity noted, although likely normal considering early/recent SAB. Hgb normal.  Assessment and Plan   A:  1. Pregnancy of unknown anatomic location   2. Vaginal bleeding affecting early pregnancy   3. Threatened miscarriage in early pregnancy      P:  Discharge home Return on Saturday for Hcg level. Patient said she might come Friday night as this is better for her schedule Bleeding precautions Pelvic rest Support given  Lezlie Lye, NP 12/14/2021 7:36 PM

## 2021-12-17 ENCOUNTER — Other Ambulatory Visit (HOSPITAL_COMMUNITY): Admit: 2021-12-17 | Payer: BC Managed Care – PPO

## 2021-12-18 ENCOUNTER — Telehealth: Payer: Self-pay | Admitting: Certified Nurse Midwife

## 2021-12-18 NOTE — Telephone Encounter (Signed)
Pt no showed, "had something come up". States she has no insurance and plans to f/u on March 20th with her OB. Informed we could not r/o ectopic pregnancy based on her previous evaluation and recommend serial qhcgs. Pt verbalized understanding.

## 2021-12-28 ENCOUNTER — Ambulatory Visit: Payer: Self-pay | Admitting: Family Medicine

## 2022-01-09 ENCOUNTER — Ambulatory Visit: Payer: Self-pay | Admitting: Family Medicine

## 2022-04-03 DIAGNOSIS — Z0000 Encounter for general adult medical examination without abnormal findings: Secondary | ICD-10-CM | POA: Diagnosis not present

## 2022-04-03 DIAGNOSIS — Z13220 Encounter for screening for lipoid disorders: Secondary | ICD-10-CM | POA: Diagnosis not present

## 2022-10-29 IMAGING — US US OB < 14 WEEKS - US OB TV
1 series · 15 of 28 positions shown · non-contrast
Comparison: None.

CLINICAL DATA: Heavy bleeding, quantitative beta HCG 171

EXAM:
OBSTETRIC <14 WK US AND TRANSVAGINAL OB US
TECHNIQUE: Both transabdominal and transvaginal ultrasound examinations were
performed for complete evaluation of the gestation as well as the
maternal uterus, adnexal regions, and pelvic cul-de-sac.
Transvaginal technique was performed to assess early pregnancy.

[Series 1: us ob < 14 weeks - us ob tv · 15 of 65 slices shown]
[im 1/65]
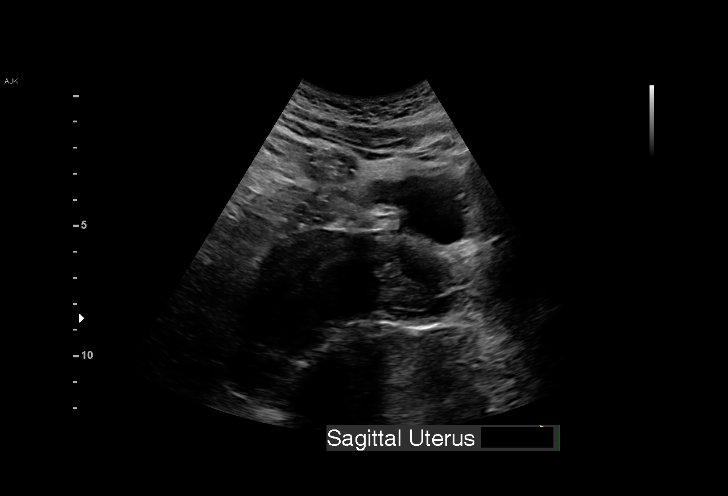
[im 5/65]
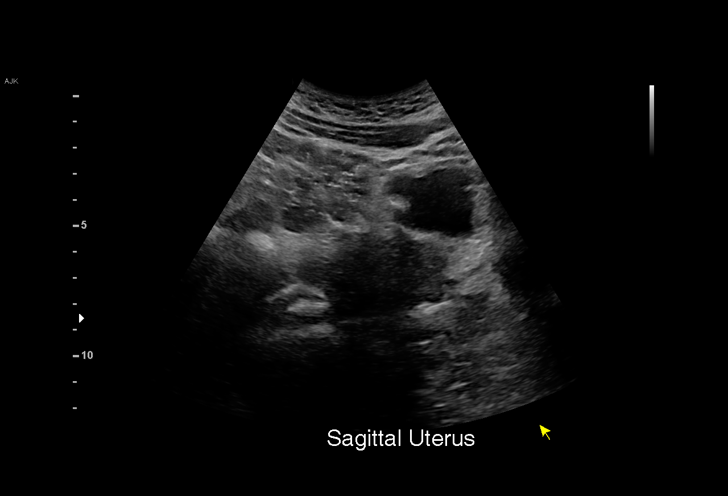
[im 10/65]
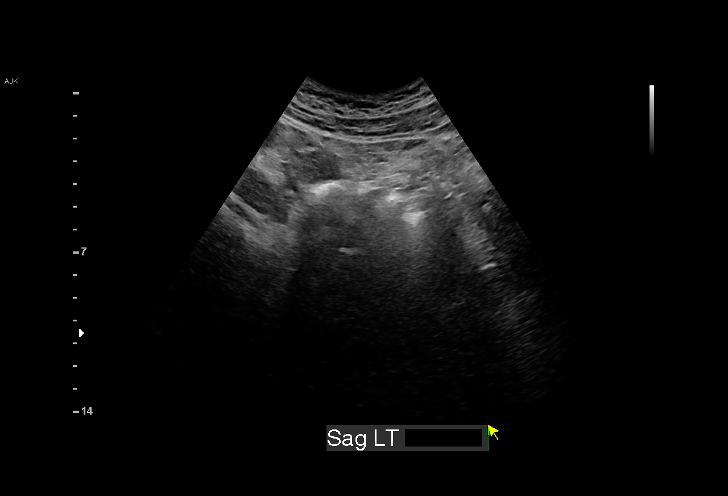
[im 15/65]
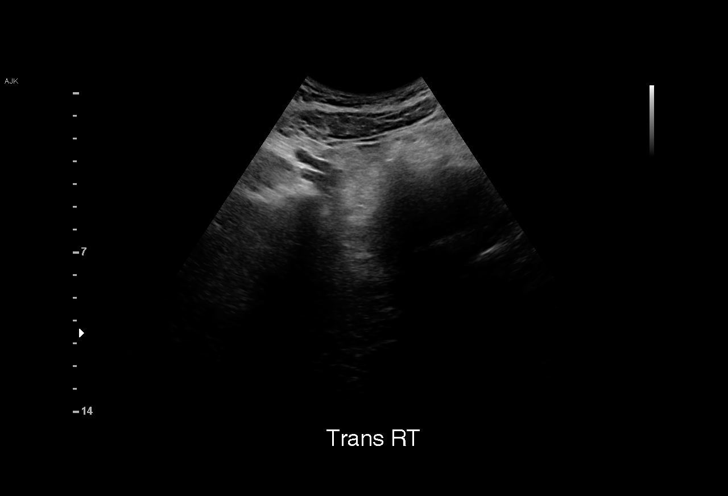
[im 19/65]
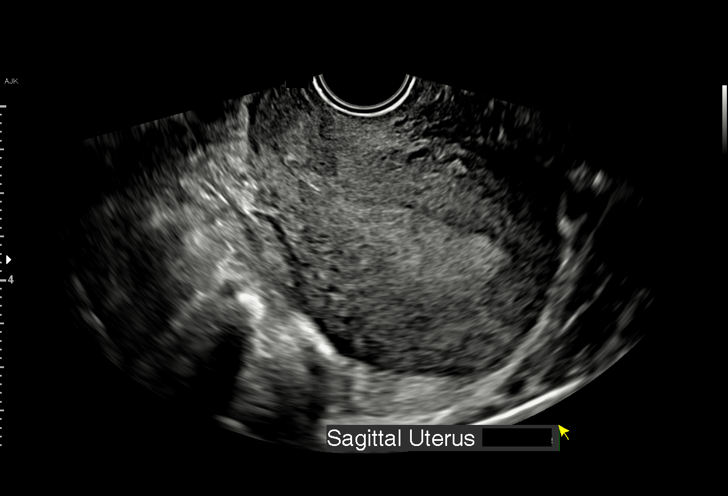
[im 24/65]
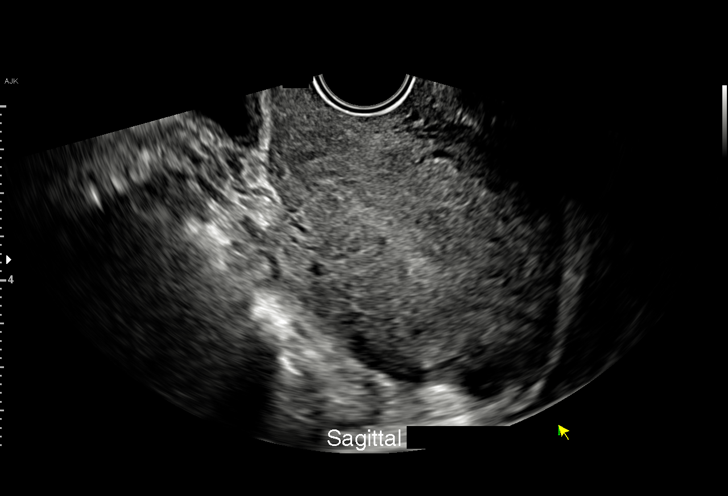
[im 29/65]
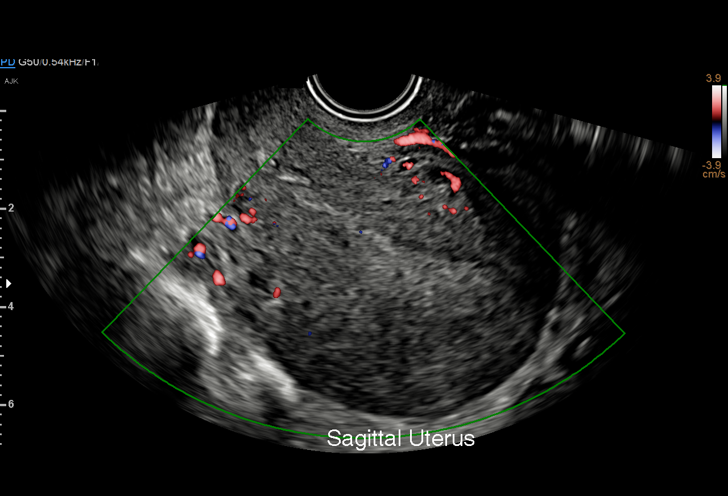
[im 34/65]
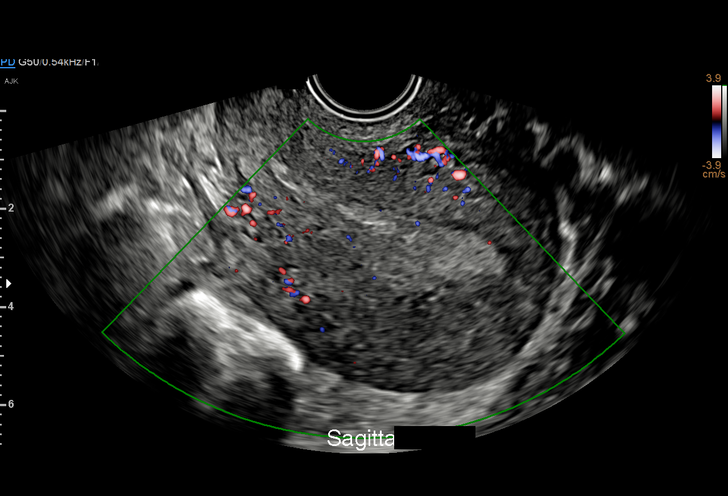
[im 36/65]
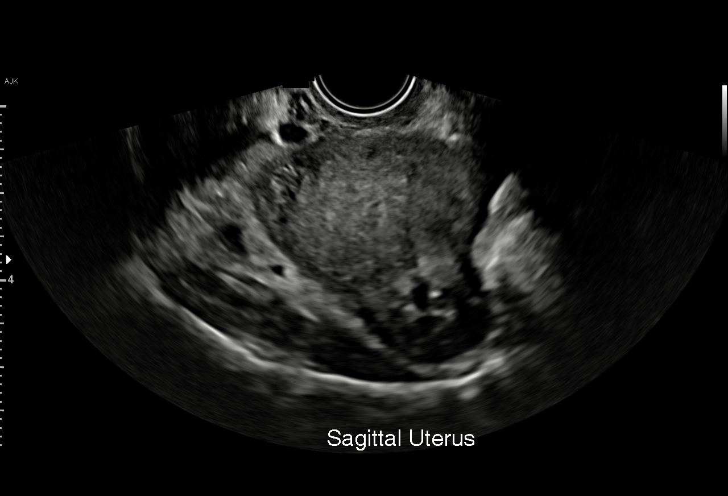
[im 41/65]
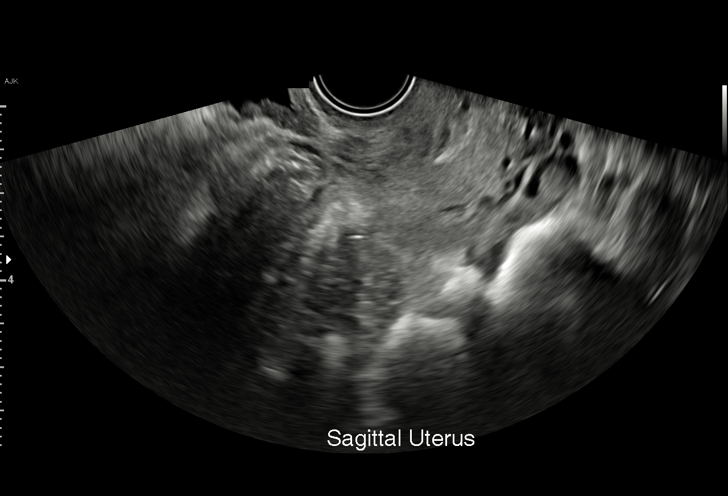
[im 46/65]
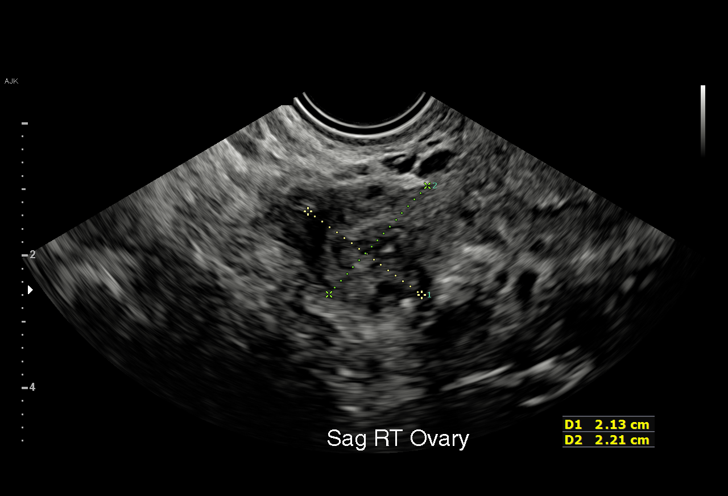
[im 50/65]
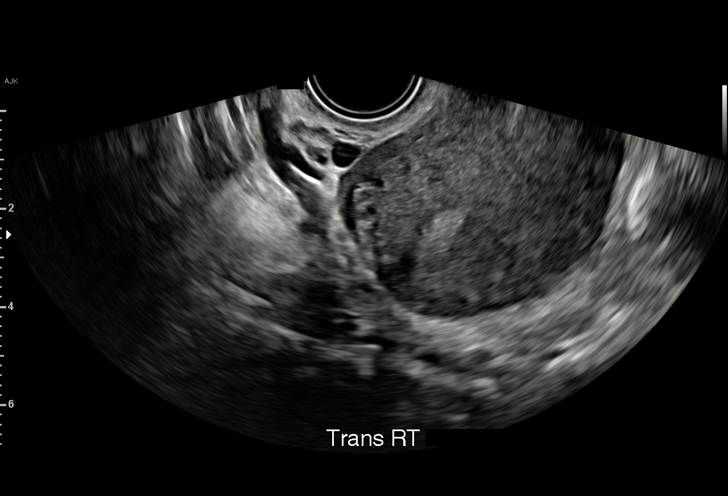
[im 55/65]
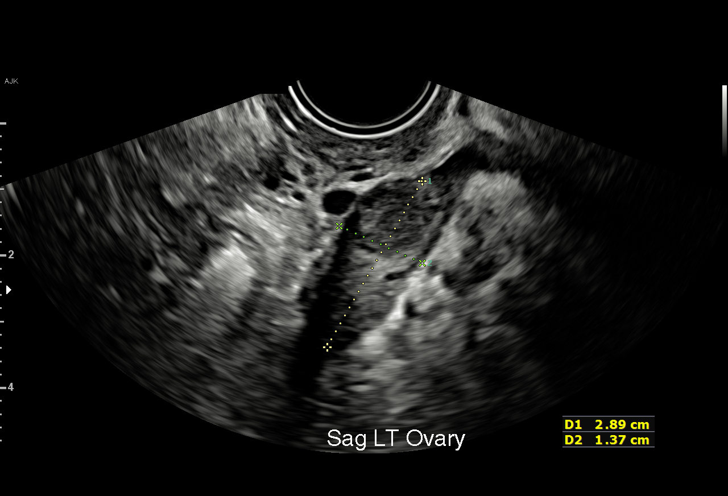
[im 60/65]
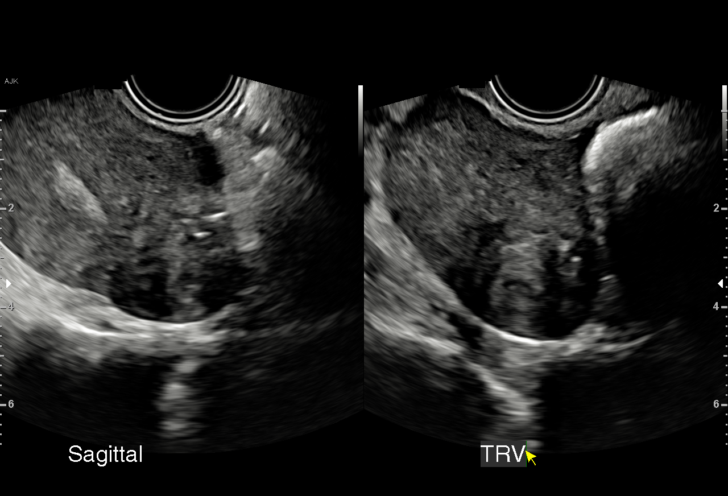
[im 65/65]
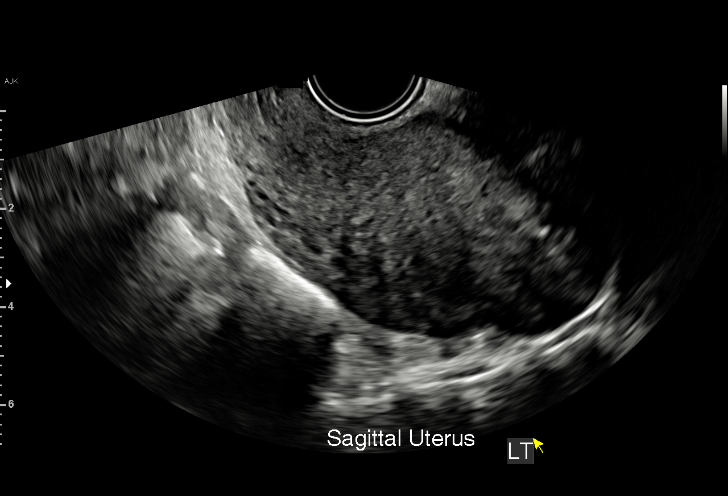

[15 of 28 positions shown; findings below may reference images not displayed]

FINDINGS: Intrauterine gestational sac: None

Yolk sac:  Not Visualized.

Embryo:  Not Visualized.

Subchorionic hemorrhage:  None visualized.

Maternal uterus/adnexae: Bilateral ovaries are unremarkable. The
endometrium measures up to 13 mm with associated trace vascularity.
A intramural fundal fibroid measuring 2.9 x 2 x 3 cm is noted.

Other: Trace free fluid within the pelvis.
IMPRESSION: 1. No intrauterine pregnancy.
2. No ectopic pregnancy.
3. Endometrium measures up to 13 mm with associated trace
vascularity. Findings could represent retained products of
conception.

## 2023-02-13 ENCOUNTER — Telehealth: Payer: Self-pay | Admitting: *Deleted

## 2023-02-13 NOTE — Telephone Encounter (Signed)
Left message to try and get pt scheduled for OB interview appointment.

## 2023-02-27 ENCOUNTER — Telehealth: Payer: Self-pay | Admitting: *Deleted

## 2023-02-27 NOTE — Telephone Encounter (Signed)
Called pt to get her scheduled for OB interview, pt states she ended up miscarrying and will reach out to follow up after she has her appt at the health dept to update her pap smear.

## 2023-03-20 ENCOUNTER — Encounter: Payer: Self-pay | Admitting: Obstetrics & Gynecology

## 2023-12-11 DIAGNOSIS — J069 Acute upper respiratory infection, unspecified: Secondary | ICD-10-CM | POA: Diagnosis not present

## 2023-12-11 DIAGNOSIS — R053 Chronic cough: Secondary | ICD-10-CM | POA: Diagnosis not present

## 2024-04-17 DIAGNOSIS — Z1329 Encounter for screening for other suspected endocrine disorder: Secondary | ICD-10-CM | POA: Diagnosis not present

## 2024-04-17 DIAGNOSIS — Z0000 Encounter for general adult medical examination without abnormal findings: Secondary | ICD-10-CM | POA: Diagnosis not present

## 2024-04-17 DIAGNOSIS — Z01419 Encounter for gynecological examination (general) (routine) without abnormal findings: Secondary | ICD-10-CM | POA: Diagnosis not present

## 2024-04-17 DIAGNOSIS — Z131 Encounter for screening for diabetes mellitus: Secondary | ICD-10-CM | POA: Diagnosis not present

## 2024-04-17 DIAGNOSIS — Z1331 Encounter for screening for depression: Secondary | ICD-10-CM | POA: Diagnosis not present

## 2024-04-17 DIAGNOSIS — Z124 Encounter for screening for malignant neoplasm of cervix: Secondary | ICD-10-CM | POA: Diagnosis not present

## 2024-04-17 DIAGNOSIS — Z13220 Encounter for screening for lipoid disorders: Secondary | ICD-10-CM | POA: Diagnosis not present

## 2024-05-08 DIAGNOSIS — N96 Recurrent pregnancy loss: Secondary | ICD-10-CM | POA: Diagnosis not present

## 2024-06-11 DIAGNOSIS — Z1589 Genetic susceptibility to other disease: Secondary | ICD-10-CM | POA: Diagnosis not present

## 2024-06-11 DIAGNOSIS — N96 Recurrent pregnancy loss: Secondary | ICD-10-CM | POA: Diagnosis not present

## 2024-07-21 DIAGNOSIS — N644 Mastodynia: Secondary | ICD-10-CM | POA: Diagnosis not present

## 2024-07-21 DIAGNOSIS — N63 Unspecified lump in unspecified breast: Secondary | ICD-10-CM | POA: Diagnosis not present

## 2024-07-29 ENCOUNTER — Other Ambulatory Visit: Payer: Self-pay | Admitting: Nurse Practitioner

## 2024-07-29 DIAGNOSIS — N63 Unspecified lump in unspecified breast: Secondary | ICD-10-CM

## 2024-07-29 DIAGNOSIS — N644 Mastodynia: Secondary | ICD-10-CM

## 2024-08-06 ENCOUNTER — Inpatient Hospital Stay
Admission: RE | Admit: 2024-08-06 | Discharge: 2024-08-06 | Payer: Self-pay | Attending: Nurse Practitioner | Admitting: Nurse Practitioner

## 2024-08-06 ENCOUNTER — Ambulatory Visit
Admission: RE | Admit: 2024-08-06 | Discharge: 2024-08-06 | Disposition: A | Discharge status: Home / Self Care | Payer: Self-pay | Source: Ambulatory Visit | Attending: Nurse Practitioner | Admitting: Nurse Practitioner

## 2024-08-06 DIAGNOSIS — N6310 Unspecified lump in the right breast, unspecified quadrant: Secondary | ICD-10-CM | POA: Diagnosis not present

## 2024-08-06 DIAGNOSIS — R928 Other abnormal and inconclusive findings on diagnostic imaging of breast: Secondary | ICD-10-CM | POA: Diagnosis not present

## 2024-08-06 DIAGNOSIS — N63 Unspecified lump in unspecified breast: Secondary | ICD-10-CM

## 2024-08-06 DIAGNOSIS — N644 Mastodynia: Secondary | ICD-10-CM

## 2024-08-15 DIAGNOSIS — B351 Tinea unguium: Secondary | ICD-10-CM | POA: Diagnosis not present

## 2024-08-15 DIAGNOSIS — N611 Abscess of the breast and nipple: Secondary | ICD-10-CM | POA: Diagnosis not present

## 2024-08-22 ENCOUNTER — Other Ambulatory Visit: Payer: Self-pay
